# Patient Record
Sex: Female | Born: 1985 | Race: White | Hispanic: No | Marital: Single | Smoking: Former smoker
Health system: Southern US, Community
[De-identification: ages and names within clinical notes are randomized; demographics above are authoritative.]

## PROBLEM LIST (undated history)

## (undated) ENCOUNTER — Inpatient Hospital Stay (HOSPITAL_COMMUNITY): Payer: Self-pay

## (undated) DIAGNOSIS — O139 Gestational [pregnancy-induced] hypertension without significant proteinuria, unspecified trimester: Secondary | ICD-10-CM

## (undated) DIAGNOSIS — O24419 Gestational diabetes mellitus in pregnancy, unspecified control: Secondary | ICD-10-CM

## (undated) DIAGNOSIS — R112 Nausea with vomiting, unspecified: Secondary | ICD-10-CM

## (undated) DIAGNOSIS — Z9889 Other specified postprocedural states: Secondary | ICD-10-CM

---
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
## 2004-07-16 ENCOUNTER — Other Ambulatory Visit: Admission: RE | Admit: 2004-07-16 | Discharge: 2004-07-16 | Payer: Self-pay | Admitting: Gynecology

## 2006-12-19 ENCOUNTER — Emergency Department (HOSPITAL_COMMUNITY): Admission: EM | Admit: 2006-12-19 | Discharge: 2006-12-19 | Payer: Self-pay | Admitting: Family Medicine

## 2007-01-23 ENCOUNTER — Emergency Department (HOSPITAL_COMMUNITY): Admission: EM | Admit: 2007-01-23 | Discharge: 2007-01-24 | Payer: Self-pay | Admitting: Emergency Medicine

## 2009-11-28 DIAGNOSIS — IMO0002 Reserved for concepts with insufficient information to code with codable children: Secondary | ICD-10-CM

## 2009-11-28 DIAGNOSIS — O139 Gestational [pregnancy-induced] hypertension without significant proteinuria, unspecified trimester: Secondary | ICD-10-CM

## 2009-11-28 HISTORY — DX: Gestational (pregnancy-induced) hypertension without significant proteinuria, unspecified trimester: O13.9

## 2010-01-20 ENCOUNTER — Emergency Department (HOSPITAL_COMMUNITY): Admission: EM | Admit: 2010-01-20 | Discharge: 2010-01-20 | Payer: Self-pay | Admitting: Family Medicine

## 2010-04-14 ENCOUNTER — Ambulatory Visit (HOSPITAL_COMMUNITY): Admission: RE | Admit: 2010-04-14 | Discharge: 2010-04-14 | Payer: Self-pay | Admitting: Obstetrics and Gynecology

## 2010-05-03 ENCOUNTER — Emergency Department (HOSPITAL_COMMUNITY): Admission: EM | Admit: 2010-05-03 | Discharge: 2010-05-03 | Payer: Self-pay | Admitting: Emergency Medicine

## 2010-06-11 ENCOUNTER — Inpatient Hospital Stay (HOSPITAL_COMMUNITY): Admission: AD | Admit: 2010-06-11 | Discharge: 2010-06-11 | Payer: Self-pay | Admitting: Obstetrics and Gynecology

## 2010-06-11 ENCOUNTER — Ambulatory Visit (HOSPITAL_COMMUNITY): Admission: RE | Admit: 2010-06-11 | Discharge: 2010-06-11 | Payer: Self-pay | Admitting: Obstetrics and Gynecology

## 2010-06-11 ENCOUNTER — Ambulatory Visit: Payer: Self-pay | Admitting: Gynecology

## 2010-06-29 ENCOUNTER — Encounter: Admission: RE | Admit: 2010-06-29 | Discharge: 2010-07-28 | Payer: Self-pay | Admitting: Obstetrics and Gynecology

## 2010-07-08 ENCOUNTER — Ambulatory Visit (HOSPITAL_COMMUNITY): Admission: RE | Admit: 2010-07-08 | Discharge: 2010-07-08 | Payer: Self-pay | Admitting: Obstetrics and Gynecology

## 2010-07-28 ENCOUNTER — Encounter: Admission: RE | Admit: 2010-07-28 | Discharge: 2010-08-26 | Payer: Self-pay | Admitting: Obstetrics and Gynecology

## 2010-08-19 ENCOUNTER — Inpatient Hospital Stay (HOSPITAL_COMMUNITY): Admission: RE | Admit: 2010-08-19 | Discharge: 2010-08-22 | Payer: Self-pay | Admitting: Obstetrics and Gynecology

## 2010-12-16 ENCOUNTER — Emergency Department (HOSPITAL_COMMUNITY)
Admission: EM | Admit: 2010-12-16 | Discharge: 2010-12-17 | Payer: Self-pay | Source: Home / Self Care | Admitting: Emergency Medicine

## 2010-12-20 LAB — COMPREHENSIVE METABOLIC PANEL
ALT: 35 U/L (ref 0–35)
Albumin: 4.5 g/dL (ref 3.5–5.2)
Alkaline Phosphatase: 75 U/L (ref 39–117)
CO2: 24 mEq/L (ref 19–32)
Calcium: 9.8 mg/dL (ref 8.4–10.5)
Creatinine, Ser: 0.65 mg/dL (ref 0.4–1.2)
Potassium: 4 mEq/L (ref 3.5–5.1)
Total Bilirubin: 0.5 mg/dL (ref 0.3–1.2)
Total Protein: 7.3 g/dL (ref 6.0–8.3)

## 2010-12-20 LAB — URINALYSIS, ROUTINE W REFLEX MICROSCOPIC
Nitrite: NEGATIVE
Protein, ur: NEGATIVE mg/dL
Specific Gravity, Urine: 1.021 (ref 1.005–1.030)
Urine Glucose, Fasting: NEGATIVE mg/dL
Urobilinogen, UA: 0.2 mg/dL (ref 0.0–1.0)

## 2010-12-20 LAB — CBC
Hemoglobin: 13.8 g/dL (ref 12.0–15.0)
MCH: 27.8 pg (ref 26.0–34.0)
RBC: 4.97 MIL/uL (ref 3.87–5.11)
WBC: 7.8 10*3/uL (ref 4.0–10.5)

## 2010-12-20 LAB — DIFFERENTIAL
Basophils Absolute: 0 10*3/uL (ref 0.0–0.1)
Basophils Relative: 0 % (ref 0–1)
Eosinophils Absolute: 0.1 10*3/uL (ref 0.0–0.7)
Eosinophils Relative: 2 % (ref 0–5)
Lymphocytes Relative: 32 % (ref 12–46)
Neutrophils Relative %: 59 % (ref 43–77)

## 2010-12-20 LAB — POCT PREGNANCY, URINE: Preg Test, Ur: NEGATIVE

## 2011-02-10 LAB — GLUCOSE, CAPILLARY
Glucose-Capillary: 80 mg/dL (ref 70–99)
Glucose-Capillary: 90 mg/dL (ref 70–99)
Glucose-Capillary: 90 mg/dL (ref 70–99)
Glucose-Capillary: 92 mg/dL (ref 70–99)
Glucose-Capillary: 98 mg/dL (ref 70–99)

## 2011-02-10 LAB — CBC
HCT: 37.6 % (ref 36.0–46.0)
MCH: 29.6 pg (ref 26.0–34.0)
MCHC: 34.3 g/dL (ref 30.0–36.0)
MCHC: 35.3 g/dL (ref 30.0–36.0)
MCV: 86.8 fL (ref 78.0–100.0)
Platelets: 208 10*3/uL (ref 150–400)
Platelets: 233 10*3/uL (ref 150–400)
WBC: 16.7 10*3/uL — ABNORMAL HIGH (ref 4.0–10.5)

## 2011-02-10 LAB — RPR: RPR Ser Ql: NONREACTIVE

## 2011-02-12 LAB — ALT: ALT: 82 U/L — ABNORMAL HIGH (ref 0–35)

## 2011-02-12 LAB — URINE MICROSCOPIC-ADD ON

## 2011-02-12 LAB — DIFFERENTIAL
Basophils Relative: 0 % (ref 0–1)
Eosinophils Absolute: 0.1 10*3/uL (ref 0.0–0.7)
Eosinophils Relative: 1 % (ref 0–5)
Lymphs Abs: 1.9 10*3/uL (ref 0.7–4.0)
Monocytes Relative: 6 % (ref 3–12)

## 2011-02-12 LAB — CBC
Hemoglobin: 12.8 g/dL (ref 12.0–15.0)
MCH: 30.6 pg (ref 26.0–34.0)
MCHC: 35.1 g/dL (ref 30.0–36.0)
MCV: 87.1 fL (ref 78.0–100.0)
RBC: 4.19 MIL/uL (ref 3.87–5.11)

## 2011-02-12 LAB — URINALYSIS, ROUTINE W REFLEX MICROSCOPIC
Bilirubin Urine: NEGATIVE
Glucose, UA: NEGATIVE mg/dL
Hgb urine dipstick: NEGATIVE
Ketones, ur: NEGATIVE mg/dL
Specific Gravity, Urine: 1.02 (ref 1.005–1.030)
pH: 7 (ref 5.0–8.0)

## 2011-02-12 LAB — URINE CULTURE

## 2011-02-14 LAB — URINE MICROSCOPIC-ADD ON

## 2011-02-14 LAB — URINALYSIS, ROUTINE W REFLEX MICROSCOPIC
Glucose, UA: NEGATIVE mg/dL
Protein, ur: 30 mg/dL — AB
Specific Gravity, Urine: 1.013 (ref 1.005–1.030)
pH: 7 (ref 5.0–8.0)

## 2011-02-18 LAB — POCT URINALYSIS DIP (DEVICE)
Hgb urine dipstick: NEGATIVE
Protein, ur: 30 mg/dL — AB
Specific Gravity, Urine: 1.02 (ref 1.005–1.030)
Urobilinogen, UA: 2 mg/dL — ABNORMAL HIGH (ref 0.0–1.0)

## 2011-02-18 LAB — POCT PREGNANCY, URINE: Preg Test, Ur: POSITIVE

## 2011-08-11 ENCOUNTER — Emergency Department (HOSPITAL_COMMUNITY)
Admission: EM | Admit: 2011-08-11 | Discharge: 2011-08-11 | Disposition: A | Discharge status: Home / Self Care | Payer: Medicaid Other | Attending: Emergency Medicine | Admitting: Emergency Medicine

## 2011-08-11 DIAGNOSIS — R300 Dysuria: Secondary | ICD-10-CM | POA: Insufficient documentation

## 2011-08-11 DIAGNOSIS — A64 Unspecified sexually transmitted disease: Secondary | ICD-10-CM | POA: Insufficient documentation

## 2011-08-11 LAB — URINALYSIS, ROUTINE W REFLEX MICROSCOPIC
Bilirubin Urine: NEGATIVE
Ketones, ur: NEGATIVE mg/dL
Nitrite: NEGATIVE
Protein, ur: NEGATIVE mg/dL
Specific Gravity, Urine: 1.02 (ref 1.005–1.030)
Urobilinogen, UA: 0.2 mg/dL (ref 0.0–1.0)
pH: 7 (ref 5.0–8.0)

## 2011-08-11 LAB — URINE MICROSCOPIC-ADD ON

## 2011-08-11 LAB — WET PREP, GENITAL

## 2011-08-12 LAB — GC/CHLAMYDIA PROBE AMP, GENITAL
Chlamydia, DNA Probe: NEGATIVE
GC Probe Amp, Genital: NEGATIVE

## 2011-08-13 LAB — URINE CULTURE: Colony Count: >=100000

## 2012-12-21 ENCOUNTER — Encounter (HOSPITAL_BASED_OUTPATIENT_CLINIC_OR_DEPARTMENT_OTHER): Payer: Self-pay | Admitting: *Deleted

## 2012-12-21 ENCOUNTER — Emergency Department (HOSPITAL_BASED_OUTPATIENT_CLINIC_OR_DEPARTMENT_OTHER)
Admission: EM | Admit: 2012-12-21 | Discharge: 2012-12-21 | Disposition: A | Discharge status: Home / Self Care | Payer: Self-pay | Attending: Emergency Medicine | Admitting: Emergency Medicine

## 2012-12-21 DIAGNOSIS — J069 Acute upper respiratory infection, unspecified: Secondary | ICD-10-CM | POA: Insufficient documentation

## 2012-12-21 MED ORDER — HYDROCOD POLST-CHLORPHEN POLST 10-8 MG/5ML PO LQCR: 5.0000 mL | Freq: Two times a day (BID) | ORAL | Status: DC | PRN

## 2012-12-21 NOTE — ED Provider Notes (Signed)
Medical screening examination/treatment/procedure(s) were performed by non-physician practitioner and as supervising physician I was immediately available for consultation/collaboration.   Carleene Cooper III, MD 12/21/12 2011

## 2012-12-21 NOTE — ED Notes (Signed)
Pt c/o URI symptoms x 3 days 

## 2012-12-21 NOTE — ED Notes (Signed)
Pt reports generalized body aches, cough and diarrhea x 2 days.

## 2012-12-21 NOTE — ED Provider Notes (Signed)
History     CSN: [id number]  Arrival date & time 12/21/12  1732   First MD Initiated Contact with Patient 12/21/12 1740      Chief Complaint  Patient presents with  . URI    (Consider location/radiation/quality/duration/timing/severity/associated sxs/prior treatment) Patient is a 27 y.o. female presenting with URI. The history is provided by the patient. No language interpreter was used.  URI The primary symptoms include cough. Primary symptoms do not include fever, sore throat, nausea or vomiting. The current episode started 3 to 5 days ago. This is a new problem. The problem has not changed since onset. Symptoms associated with the illness include congestion.    History reviewed. No pertinent past medical history.  History reviewed. No pertinent past surgical history.  History reviewed. No pertinent family history.  History  Substance Use Topics  . Smoking status: Never Smoker   . Smokeless tobacco: Not on file  . Alcohol Use: No    OB History    Grav Para Term Preterm Abortions TAB SAB Ect Mult Living                  Review of Systems  Constitutional: Negative for fever.  HENT: Positive for congestion. Negative for sore throat.   Respiratory: Positive for cough.   Gastrointestinal: Negative for nausea and vomiting.    Allergies  Penicillins  Home Medications   Current Outpatient Rx  Name  Route  Sig  Dispense  Refill  . HYDROCOD POLST-CPM POLST ER 10-8 MG/5ML PO LQCR   Oral   Take 5 mLs by mouth every 12 (twelve) hours as needed.   140 mL   0     BP 135/88  Pulse 100  Temp 99.3 F (37.4 C) (Oral)  Resp 16  Ht 5\' 1"  (1.549 m)  Wt 230 lb (104.327 kg)  BMI 43.46 kg/m2  SpO2 100%  LMP 12/20/2012  Physical Exam  Nursing note and vitals reviewed. Constitutional: She is oriented to person, place, and time. She appears well-developed and well-nourished.  HENT:  Head: Normocephalic and atraumatic.  Right Ear: External ear normal.  Left Ear:  External ear normal.  Nose: Rhinorrhea present.  Eyes: Conjunctivae normal and EOM are normal.  Neck: Neck supple.  Cardiovascular: Normal rate and regular rhythm.   Pulmonary/Chest: Effort normal and breath sounds normal.  Musculoskeletal: Normal range of motion.  Neurological: She is alert and oriented to person, place, and time.    ED Course  Procedures (including critical care time)  Labs Reviewed - No data to display No results found.   1. URI (upper respiratory infection)       MDM  Lungs clear:will treat symptomatically for cough        Teressa Lower, NP 12/21/12 1805

## 2013-04-29 ENCOUNTER — Emergency Department (HOSPITAL_BASED_OUTPATIENT_CLINIC_OR_DEPARTMENT_OTHER)
Admission: EM | Admit: 2013-04-29 | Discharge: 2013-04-29 | Disposition: A | Discharge status: Home / Self Care | Payer: Self-pay | Attending: Emergency Medicine | Admitting: Emergency Medicine

## 2013-04-29 ENCOUNTER — Encounter (HOSPITAL_BASED_OUTPATIENT_CLINIC_OR_DEPARTMENT_OTHER): Payer: Self-pay | Admitting: *Deleted

## 2013-04-29 DIAGNOSIS — R05 Cough: Secondary | ICD-10-CM | POA: Insufficient documentation

## 2013-04-29 DIAGNOSIS — N390 Urinary tract infection, site not specified: Secondary | ICD-10-CM | POA: Insufficient documentation

## 2013-04-29 DIAGNOSIS — J029 Acute pharyngitis, unspecified: Secondary | ICD-10-CM | POA: Insufficient documentation

## 2013-04-29 DIAGNOSIS — J069 Acute upper respiratory infection, unspecified: Secondary | ICD-10-CM | POA: Insufficient documentation

## 2013-04-29 DIAGNOSIS — R059 Cough, unspecified: Secondary | ICD-10-CM | POA: Insufficient documentation

## 2013-04-29 DIAGNOSIS — J3489 Other specified disorders of nose and nasal sinuses: Secondary | ICD-10-CM | POA: Insufficient documentation

## 2013-04-29 DIAGNOSIS — Z3202 Encounter for pregnancy test, result negative: Secondary | ICD-10-CM | POA: Insufficient documentation

## 2013-04-29 DIAGNOSIS — R1310 Dysphagia, unspecified: Secondary | ICD-10-CM | POA: Insufficient documentation

## 2013-04-29 DIAGNOSIS — R300 Dysuria: Secondary | ICD-10-CM | POA: Insufficient documentation

## 2013-04-29 DIAGNOSIS — R070 Pain in throat: Secondary | ICD-10-CM | POA: Insufficient documentation

## 2013-04-29 DIAGNOSIS — Z87891 Personal history of nicotine dependence: Secondary | ICD-10-CM | POA: Insufficient documentation

## 2013-04-29 LAB — URINALYSIS, ROUTINE W REFLEX MICROSCOPIC
Ketones, ur: NEGATIVE mg/dL
Nitrite: NEGATIVE
Specific Gravity, Urine: 1.027 (ref 1.005–1.030)
pH: 6.5 (ref 5.0–8.0)

## 2013-04-29 LAB — RAPID STREP SCREEN (MED CTR MEBANE ONLY): Streptococcus, Group A Screen (Direct): NEGATIVE

## 2013-04-29 LAB — URINE MICROSCOPIC-ADD ON

## 2013-04-29 MED ORDER — SULFAMETHOXAZOLE-TRIMETHOPRIM 800-160 MG PO TABS: 1.0000 | ORAL_TABLET | Freq: Two times a day (BID) | ORAL | Status: AC

## 2013-04-29 NOTE — ED Notes (Signed)
Pt c/o URI symptoms x 1 week and UTI symtpoms x 1 day

## 2013-04-29 NOTE — ED Provider Notes (Signed)
History     CSN: [id number]  Arrival date & time 04/29/13  1322   First MD Initiated Contact with Patient 04/29/13 1331      Chief Complaint  Patient presents with  . URI  . Hematuria    (Consider location/radiation/quality/duration/timing/severity/associated sxs/prior treatment) Patient is a 27 y.o. female presenting with URI and hematuria.  URI Hematuria   Pt reports about a week of dry cough, progressed to nasal congestion, sore throat and productive cough about 3 days ago. She has moderate aching pain in throat, worse with coughing and swallowing. No fever. She works in Audiological scientist.   She also reports small amount of blood in urine and dysuria since yesterday evening. No flank pain or fever. She has had similar symptoms in the past with UTI.  History reviewed. No pertinent past medical history.  History reviewed. No pertinent past surgical history.  History reviewed. No pertinent family history.  History  Substance Use Topics  . Smoking status: Former Games developer  . Smokeless tobacco: Not on file  . Alcohol Use: No    OB History   Grav Para Term Preterm Abortions TAB SAB Ect Mult Living                  Review of Systems  Genitourinary: Positive for hematuria.   All other systems reviewed and are negative except as noted in HPI.    Allergies  Penicillins  Home Medications  No current outpatient prescriptions on file.  BP 148/92  Pulse 99  Temp(Src) 98.3 F (36.8 C) (Oral)  Resp 16  SpO2 100%  LMP 04/14/2013  Physical Exam  Nursing note and vitals reviewed. Constitutional: She is oriented to person, place, and time. She appears well-developed and well-nourished.  HENT:  Head: Normocephalic and atraumatic.  Tonsils 2+ but no exudate  Eyes: EOM are normal. Pupils are equal, round, and reactive to light.  Neck: Normal range of motion. Neck supple.  Cardiovascular: Normal rate, normal heart sounds and intact distal pulses.   Pulmonary/Chest: Effort normal  and breath sounds normal.  Abdominal: Bowel sounds are normal. She exhibits no distension. There is no tenderness.  Musculoskeletal: Normal range of motion. She exhibits no edema and no tenderness.  Lymphadenopathy:    She has no cervical adenopathy.  Neurological: She is alert and oriented to person, place, and time. She has normal strength. No cranial nerve deficit or sensory deficit.  Skin: Skin is warm and dry. No rash noted.  Psychiatric: She has a normal mood and affect.    ED Course  Procedures (including critical care time)  Labs Reviewed  URINALYSIS, ROUTINE W REFLEX MICROSCOPIC - Abnormal; Notable for the following:    APPearance CLOUDY (*)    Leukocytes, UA MODERATE (*)    All other components within normal limits  URINE MICROSCOPIC-ADD ON - Abnormal; Notable for the following:    Bacteria, UA MANY (*)    All other components within normal limits  RAPID STREP SCREEN  URINE CULTURE  CULTURE, GROUP A STREP  PREGNANCY, URINE   No results found.   1. UTI (urinary tract infection)   2. URI (upper respiratory infection)       MDM  UA suspicious for UTI given symptoms. Will check strep as well to coordinate Abx if necessary.   2:30 PM Strep neg, likely viral URI with early UTI. Bactrim and symptomatic OTC meds.       Erva Koke B. Bernette Mayers, MD 04/29/13 1430

## 2013-05-01 LAB — URINE CULTURE: Colony Count: >=100000

## 2013-05-01 LAB — CULTURE, GROUP A STREP

## 2013-05-02 ENCOUNTER — Telehealth (HOSPITAL_COMMUNITY): Payer: Self-pay | Admitting: Emergency Medicine

## 2013-05-02 NOTE — ED Notes (Signed)
Post ED Visit - Positive Culture Follow-up  Culture report reviewed by antimicrobial stewardship pharmacist: []  Wes Dulaney, Pharm.D., BCPS []  Celedonio Miyamoto, Pharm.D., BCPS [x]  Georgina Pillion, [address].D., BCPS []  [place], [address].D., BCPS, AAHIVP []  Estella Husk, Pharm.D., BCPS, AAHIVP  Positive urine culture Treated with Bactrim, organism sensitive to the same and no further patient follow-up is required at this time.  Kylie A Holland 05/02/2013, 12:37 PM

## 2014-06-19 ENCOUNTER — Encounter (HOSPITAL_BASED_OUTPATIENT_CLINIC_OR_DEPARTMENT_OTHER): Payer: Self-pay | Admitting: Emergency Medicine

## 2014-06-19 ENCOUNTER — Emergency Department (HOSPITAL_BASED_OUTPATIENT_CLINIC_OR_DEPARTMENT_OTHER)
Admission: EM | Admit: 2014-06-19 | Discharge: 2014-06-19 | Disposition: A | Discharge status: Home / Self Care | Payer: BC Managed Care – PPO | Attending: Emergency Medicine | Admitting: Emergency Medicine

## 2014-06-19 DIAGNOSIS — R51 Headache: Secondary | ICD-10-CM | POA: Insufficient documentation

## 2014-06-19 DIAGNOSIS — O2390 Unspecified genitourinary tract infection in pregnancy, unspecified trimester: Secondary | ICD-10-CM | POA: Insufficient documentation

## 2014-06-19 DIAGNOSIS — Z3490 Encounter for supervision of normal pregnancy, unspecified, unspecified trimester: Secondary | ICD-10-CM

## 2014-06-19 DIAGNOSIS — F17200 Nicotine dependence, unspecified, uncomplicated: Secondary | ICD-10-CM | POA: Insufficient documentation

## 2014-06-19 DIAGNOSIS — N12 Tubulo-interstitial nephritis, not specified as acute or chronic: Secondary | ICD-10-CM | POA: Insufficient documentation

## 2014-06-19 DIAGNOSIS — Z8632 Personal history of gestational diabetes: Secondary | ICD-10-CM | POA: Insufficient documentation

## 2014-06-19 DIAGNOSIS — O9989 Other specified diseases and conditions complicating pregnancy, childbirth and the puerperium: Secondary | ICD-10-CM | POA: Insufficient documentation

## 2014-06-19 DIAGNOSIS — R197 Diarrhea, unspecified: Secondary | ICD-10-CM | POA: Insufficient documentation

## 2014-06-19 DIAGNOSIS — Z880 Allergy status to penicillin: Secondary | ICD-10-CM | POA: Insufficient documentation

## 2014-06-19 HISTORY — DX: Gestational diabetes mellitus in pregnancy, unspecified control: O24.419

## 2014-06-19 LAB — URINALYSIS, ROUTINE W REFLEX MICROSCOPIC
BILIRUBIN URINE: NEGATIVE
GLUCOSE, UA: NEGATIVE mg/dL
Hgb urine dipstick: NEGATIVE
KETONES UR: NEGATIVE mg/dL
Nitrite: POSITIVE — AB
PH: 6 (ref 5.0–8.0)
Protein, ur: NEGATIVE mg/dL
Specific Gravity, Urine: 1.024 (ref 1.005–1.030)
Urobilinogen, UA: 0.2 mg/dL (ref 0.0–1.0)

## 2014-06-19 LAB — COMPREHENSIVE METABOLIC PANEL
ALBUMIN: 4.2 g/dL (ref 3.5–5.2)
ALT: 21 U/L (ref 0–35)
AST: 21 U/L (ref 0–37)
Alkaline Phosphatase: 78 U/L (ref 39–117)
Anion gap: 15 (ref 5–15)
BUN: 9 mg/dL (ref 6–23)
CALCIUM: 10.2 mg/dL (ref 8.4–10.5)
CO2: 24 mEq/L (ref 19–32)
CREATININE: 0.6 mg/dL (ref 0.50–1.10)
Chloride: 102 mEq/L (ref 96–112)
GFR calc Af Amer: >90 mL/min (ref >90)
GFR calc non Af Amer: >90 mL/min (ref >90)
Glucose, Bld: 91 mg/dL (ref 70–99)
Potassium: 4.5 mEq/L (ref 3.7–5.3)
SODIUM: 141 meq/L (ref 137–147)
TOTAL PROTEIN: 7.7 g/dL (ref 6.0–8.3)
Total Bilirubin: <0.2 mg/dL — ABNORMAL LOW (ref 0.3–1.2)

## 2014-06-19 LAB — CBC WITH DIFFERENTIAL/PLATELET
Basophils Absolute: 0 10*3/uL (ref 0.0–0.1)
Basophils Relative: 0 % (ref 0–1)
EOS PCT: 1 % (ref 0–5)
Eosinophils Absolute: 0.1 10*3/uL (ref 0.0–0.7)
HEMATOCRIT: 42.1 % (ref 36.0–46.0)
HEMOGLOBIN: 14.7 g/dL (ref 12.0–15.0)
LYMPHS PCT: 22 % (ref 12–46)
Lymphs Abs: 2.1 10*3/uL (ref 0.7–4.0)
MCH: 30.1 pg (ref 26.0–34.0)
MCHC: 34.9 g/dL (ref 30.0–36.0)
MCV: 86.1 fL (ref 78.0–100.0)
MONO ABS: 0.7 10*3/uL (ref 0.1–1.0)
MONOS PCT: 7 % (ref 3–12)
NEUTROS ABS: 6.6 10*3/uL (ref 1.7–7.7)
Neutrophils Relative %: 69 % (ref 43–77)
Platelets: 262 10*3/uL (ref 150–400)
RBC: 4.89 MIL/uL (ref 3.87–5.11)
RDW: 13.4 % (ref 11.5–15.5)
WBC: 9.5 10*3/uL (ref 4.0–10.5)

## 2014-06-19 LAB — URINE MICROSCOPIC-ADD ON

## 2014-06-19 LAB — PREGNANCY, URINE: Preg Test, Ur: POSITIVE — AB

## 2014-06-19 MED ORDER — COMPLETENATE 29-1 MG PO CHEW: 1.0000 | CHEWABLE_TABLET | Freq: Every day | ORAL | Status: DC

## 2014-06-19 MED ORDER — CEPHALEXIN 500 MG PO CAPS: 500.0000 mg | ORAL_CAPSULE | Freq: Two times a day (BID) | ORAL | Status: DC

## 2014-06-19 MED ORDER — OXYCODONE HCL 5 MG PO TABS
5.0000 mg | ORAL_TABLET | ORAL | Status: AC
  Administered 2014-06-19: 5 mg via ORAL
  Filled 2014-06-19: qty 1

## 2014-06-19 MED ORDER — DEXTROSE 5 % IV SOLN
1.0000 g | Freq: Once | INTRAVENOUS | Status: AC
  Administered 2014-06-19: 1 g via INTRAVENOUS

## 2014-06-19 MED ORDER — ACETAMINOPHEN 325 MG PO TABS
650.0000 mg | ORAL_TABLET | Freq: Once | ORAL | Status: AC
  Administered 2014-06-19: 650 mg via ORAL
  Filled 2014-06-19: qty 2

## 2014-06-19 MED ORDER — CEFTRIAXONE SODIUM 1 G IJ SOLR
INTRAMUSCULAR | Status: AC
  Filled 2014-06-19: qty 10

## 2014-06-19 MED ORDER — OXYCODONE-ACETAMINOPHEN 5-325 MG PO TABS: 1.0000 | ORAL_TABLET | Freq: Three times a day (TID) | ORAL | Status: DC | PRN

## 2014-06-19 MED ORDER — SODIUM CHLORIDE 0.9 % IV BOLUS (SEPSIS)
1000.0000 mL | INTRAVENOUS | Status: AC
  Administered 2014-06-19: 1000 mL via INTRAVENOUS

## 2014-06-19 NOTE — ED Notes (Signed)
Pt given a glass of water and was able to drink without emesis

## 2014-06-19 NOTE — ED Notes (Signed)
Headaches, nausea, lower back pain and cramping since Friday. Pt states possibility of pregnancy.

## 2014-06-19 NOTE — ED Provider Notes (Signed)
CSN: [id number]     Arrival date & time 06/19/14  1506 History   First MD Initiated Contact with Patient 06/19/14 1643     Chief Complaint  Patient presents with  . Back Pain     (Consider location/radiation/quality/duration/timing/severity/associated sxs/prior Treatment) Patient is a 28 y.o. female presenting with back pain. The history is provided by the patient.  Back Pain Location:  Lumbar spine Quality:  Aching Radiates to:  Does not radiate Pain severity:  Moderate Pain is:  Same all the time Onset quality:  Gradual Duration:  6 days Timing:  Constant Progression:  Unchanged Chronicity:  New Context comment:  At rest Relieved by:  Nothing Worsened by:  Nothing tried Ineffective treatments:  None tried Associated symptoms: abdominal pain and headaches   Associated symptoms: no chest pain, no dysuria and no fever     Past Medical History  Diagnosis Date  . Gestational diabetes    Past Surgical History  Procedure Laterality Date  . Cesarean section     History reviewed. No pertinent family history. History  Substance Use Topics  . Smoking status: Current Some Day Smoker  . Smokeless tobacco: Not on file  . Alcohol Use: No   OB History   Grav Para Term Preterm Abortions TAB SAB Ect Mult Living                 Review of Systems  Constitutional: Negative for fever and fatigue.  HENT: Negative for congestion and drooling.   Eyes: Negative for pain.  Respiratory: Negative for cough and shortness of breath.   Cardiovascular: Negative for chest pain.  Gastrointestinal: Positive for nausea, vomiting, abdominal pain and diarrhea.  Genitourinary: Negative for dysuria and hematuria.  Musculoskeletal: Negative for back pain, gait problem and neck pain.  Skin: Negative for color change.  Neurological: Positive for headaches. Negative for dizziness.  Hematological: Negative for adenopathy.  Psychiatric/Behavioral: Negative for behavioral problems.  All other  systems reviewed and are negative.     Allergies  Penicillins  Home Medications   Prior to Admission medications   Not on File   BP 153/91  Pulse 66  Temp(Src) 97.5 F (36.4 C) (Oral)  Resp 18  Wt 275 lb 3 oz (124.824 kg)  SpO2 100%  LMP 05/17/2014 Physical Exam  Nursing note and vitals reviewed. Constitutional: She is oriented to person, place, and time. She appears well-developed and well-nourished.  HENT:  Head: Normocephalic and atraumatic.  Mouth/Throat: Oropharynx is clear and moist. No oropharyngeal exudate.  Eyes: Conjunctivae and EOM are normal. Pupils are equal, round, and reactive to light.  Neck: Normal range of motion. Neck supple.  Cardiovascular: Normal rate, regular rhythm, normal heart sounds and intact distal pulses.  Exam reveals no gallop and no friction rub.   No murmur heard. Pulmonary/Chest: Effort normal and breath sounds normal. No respiratory distress. She has no wheezes.  Abdominal: Soft. Bowel sounds are normal. There is no tenderness. There is no rebound and no guarding.  Musculoskeletal: Normal range of motion. She exhibits no edema and no tenderness.  Mild CVA tenderness to palpation bilaterally.  Neurological: She is alert and oriented to person, place, and time.  Skin: Skin is warm and dry.  Psychiatric: She has a normal mood and affect. Her behavior is normal.    ED Course  Procedures (including critical care time) Labs Review Labs Reviewed  PREGNANCY, URINE - Abnormal; Notable for the following:    Preg Test, Ur POSITIVE (*)  All other components within normal limits  URINALYSIS, ROUTINE W REFLEX MICROSCOPIC - Abnormal; Notable for the following:    APPearance CLOUDY (*)    Nitrite POSITIVE (*)    Leukocytes, UA MODERATE (*)    All other components within normal limits  URINE MICROSCOPIC-ADD ON - Abnormal; Notable for the following:    Bacteria, UA MANY (*)    All other components within normal limits  URINE CULTURE  CBC  WITH DIFFERENTIAL  COMPREHENSIVE METABOLIC PANEL    Imaging Review No results found.   EKG Interpretation None      MDM   Final diagnoses:  Pyelonephritis  Pregnant    4:57 PM 28 y.o. female who pw intermittent HA's, frequency, low back pain, lower abd cramping, nausea, vomiting, diarrhea for 6-7 days. She denies any fevers. She currently does not have a headache. She denies any vision changes. She is found to be pregnant today. She was suspicious for this. Her LMP was June 20. She has not had any vaginal bleeding, vaginal discharge. She denies any vision changes. She notes that she had a C-section with her first pregnancy d/t a nuchal chord. She also had gestational diabetes and preeclampsia with issues with her blood pressure during the pregnancy. She is afebrile here and mildly hypertensive on initial triage vital signs but also having low back pain. Will treat symptomatically with IV fluids, Tylenol, and a dose of Rocephin as it looks like she also has a UTI. While she has some mild nonspecific pelvic cramping she has no focal abdominal tenderness or adnexal tenderness. Do not suspect ectopic preg.   7:46 PM: Pain controlled. Pt feeling better, tolerating po. Got IV rocephin here.  I have discussed the diagnosis/risks/treatment options with the patient and believe the pt to be eligible for discharge home to follow-up with her obgyn. We also discussed returning to the ED immediately if new or worsening sx occur. We discussed the sx which are most concerning (e.g., inability to tolerate abx, inc vomiting, inc pain, vag bleeding) that necessitate immediate return. Medications administered to the patient during their visit and any new prescriptions provided to the patient are listed below.  Medications given during this visit Medications  sodium chloride 0.9 % bolus 1,000 mL (1,000 mLs Intravenous New Bag/Given 06/19/14 1833)  cefTRIAXone (ROCEPHIN) 1 g in dextrose 5 % 50 mL IVPB (1 g  Intravenous New Bag/Given 06/19/14 1835)  acetaminophen (TYLENOL) tablet 650 mg (650 mg Oral Given 06/19/14 1836)  oxyCODONE (Oxy IR/ROXICODONE) immediate release tablet 5 mg (5 mg Oral Given 06/19/14 1844)    New Prescriptions   CEPHALEXIN (KEFLEX) 500 MG CAPSULE    Take 1 capsule (500 mg total) by mouth 2 (two) times daily.   OXYCODONE-ACETAMINOPHEN (PERCOCET) 5-325 MG PER TABLET    Take 1 tablet by mouth every 8 (eight) hours as needed for moderate pain or severe pain.   PRENATAL VITAMIN W/FE, FA (NATACHEW) 29-1 MG CHEW CHEWABLE TABLET    Chew 1 tablet by mouth daily at 12 noon.      Junius ArgyleForrest S Ahmad Vanwey, MD 06/19/14 1946

## 2014-06-22 LAB — URINE CULTURE: Colony Count: >=100000

## 2014-06-23 ENCOUNTER — Telehealth (HOSPITAL_BASED_OUTPATIENT_CLINIC_OR_DEPARTMENT_OTHER): Payer: Self-pay

## 2014-06-23 NOTE — Telephone Encounter (Signed)
Post ED Visit - Positive Culture Follow-up  Culture report reviewed by antimicrobial stewardship pharmacist: []  Wes Dulaney, Pharm.D., BCPS [x]  Celedonio MiyamotoJeremy Frens, Pharm.D., BCPS []  Georgina PillionElizabeth Martin, [address]D., BCPS []  [place]h Pham, [address]D., BCPS, AAHIVP []  Estella HuskMichelle Turner, Pharm.D., BCPS, AAHIVP []    Positive Urine culture, >/= 100,000 colonies -> E Coli Treated with Keflex, organism sensitive to the same and no further patient follow-up is required at this time.  Arvid RightClark, Lindalee Huizinga Dorn 06/23/2014, 9:20 PM

## 2014-08-06 LAB — OB RESULTS CONSOLE HEPATITIS B SURFACE ANTIGEN: Hepatitis B Surface Ag: NEGATIVE

## 2014-08-06 LAB — OB RESULTS CONSOLE RUBELLA ANTIBODY, IGM: RUBELLA: IMMUNE

## 2014-08-06 LAB — OB RESULTS CONSOLE ABO/RH: RH Type: NEGATIVE

## 2014-08-06 LAB — OB RESULTS CONSOLE RPR: RPR: NONREACTIVE

## 2014-08-06 LAB — OB RESULTS CONSOLE ANTIBODY SCREEN: Antibody Screen: NEGATIVE

## 2014-08-06 LAB — OB RESULTS CONSOLE HIV ANTIBODY (ROUTINE TESTING): HIV: NONREACTIVE

## 2014-08-08 ENCOUNTER — Other Ambulatory Visit: Payer: Self-pay

## 2014-08-22 ENCOUNTER — Ambulatory Visit (HOSPITAL_COMMUNITY): Payer: BC Managed Care – PPO

## 2014-08-29 ENCOUNTER — Encounter (HOSPITAL_COMMUNITY): Payer: Self-pay

## 2014-08-29 ENCOUNTER — Ambulatory Visit (HOSPITAL_COMMUNITY)
Admission: RE | Admit: 2014-08-29 | Discharge: 2014-08-29 | Disposition: A | Discharge status: Home / Self Care | Payer: BC Managed Care – PPO | Source: Ambulatory Visit | Attending: Obstetrics and Gynecology | Admitting: Obstetrics and Gynecology

## 2014-08-29 VITALS — BP 128/59 | HR 86 | Wt 280.0 lb

## 2014-08-29 DIAGNOSIS — O99332 Smoking (tobacco) complicating pregnancy, second trimester: Secondary | ICD-10-CM | POA: Insufficient documentation

## 2014-08-29 DIAGNOSIS — O10012 Pre-existing essential hypertension complicating pregnancy, second trimester: Secondary | ICD-10-CM | POA: Diagnosis present

## 2014-08-29 DIAGNOSIS — Z3A16 16 weeks gestation of pregnancy: Secondary | ICD-10-CM | POA: Diagnosis not present

## 2014-08-29 DIAGNOSIS — O10912 Unspecified pre-existing hypertension complicating pregnancy, second trimester: Secondary | ICD-10-CM

## 2014-08-29 NOTE — Progress Notes (Signed)
MATERNAL FETAL MEDICINE CONSULT  Patient Name: Dominique Anderson Medical Record Number:  [id number] Date of Birth: Dec 27, 1985 Requesting Physician Name:  Philip Aspen, DO Date of Service: 08/29/2014  Chief Complaint Chronic hypertension  History of Present Illness Dominique Anderson was seen today secondary to chronic hypertension at the request of Philip Aspen, DO.  The patient is a 28 y.o. G2P1001,at [redacted]w[redacted]d with an EDD of 02/12/2015, by Ultrasound dating method.  Dominique Anderson blood pressure at her initial prenatal visit at 10 weeks of pregnancy was 142/92.  She has a history of preeclampsia in her last pregnancy, but has not had any BP elevations since then.  She is currently not taking any anti-hypertensive medications.  She denies headache, visual changes, RUQ pain, or swelling.  She also has a history of gestational diabetes in her last pregnancy.  She received an early one hour glucose tolerance test in this pregnancy which was normal.  Review of Systems Pertinent items are noted in HPI.  Patient History OB History  Gravida Para Term Preterm AB SAB TAB Ectopic Multiple Living  2 1 1       1     # Outcome Date GA Lbr Len/2nd Weight Sex Delivery Anes PTL Lv  2 CUR           1 TRM               Past Medical History  Diagnosis Date  . Gestational diabetes   . Hypertension     Past Surgical History  Procedure Laterality Date  . Cesarean section      History   Social History  . Marital Status: Single    Spouse Name: N/A    Number of Children: N/A  . Years of Education: N/A   Social History Main Topics  . Smoking status: Current Some Day Smoker  . Smokeless tobacco: None  . Alcohol Use: No  . Drug Use: No  . Sexual Activity: Yes    Birth Control/ Protection: None   Other Topics Concern  . None   Social History Narrative  . None    Physical Examination Filed Vitals:   08/29/14 1622  BP: 128/59  Pulse: 86   General appearance - alert, well appearing, and in no  distress  Assessment and Recommendations 1.  Chronic hypertension.  As Dominique Anderson BP on her initial prenatal visit at 10 weeks was 142/92 she meets criteria for chronic hypertension even though she did not have any elevated BP's prior to pregnancy  I discussed the range of complications that are associated with chronic hypertension including, but not limited to, fetal growth disturbance, preterm delivery, preeclampsia, and IUFD with Dominique Anderson.  Due to the increased risk of preeclampsia a CBC, AST, ALT, 24 hour urine collection, and serum creatinine has been performed to determine her baseline labs and proteinuria.  This should be repeated as clinically indicated based on disease symptoms or the appearance of worsening hypertension.  As chronic hypertension is associated with fetal growth restriction Dominique Anderson should have serial growth scans every 4-6 weeks after her anatomic survey at approximately 18 weeks.  Once or twice weekly fetal surveillance should be started at 32 weeks of gestation.  As treatment of women with mild chronic hypertension using oral anti-hypertensive agents has not been shown to improve maternal or neonatal outcomes, no medical treatment is needed at this time.  If Dominique Anderson develops blood pressures consistently above 150/100 an oral anti-hypertensive should be started.  Labetalol, nifedipine,  and methyldopa are all known to be safe in pregnancy.  Finally, due to her history of prior preeclampsia Dominique Anderson should take a daily baby aspirin as this has been shown to decrease the risk of recurrence.   I spent 15 minutes with Dominique Anderson today of which 50% was face-to-face counseling.  [facility].  Please do not hesitate to contact us with questions.   Rema FendtNITSCHE,Tera Pellicane, MD

## 2014-09-29 ENCOUNTER — Encounter (HOSPITAL_COMMUNITY): Payer: Self-pay

## 2014-10-09 ENCOUNTER — Encounter (HOSPITAL_COMMUNITY): Payer: Self-pay | Admitting: *Deleted

## 2014-10-09 ENCOUNTER — Inpatient Hospital Stay (HOSPITAL_COMMUNITY)
Admission: AD | Admit: 2014-10-09 | Discharge: 2014-10-09 | Disposition: A | Discharge status: Home / Self Care | Payer: BC Managed Care – PPO | Source: Ambulatory Visit | Attending: Obstetrics and Gynecology | Admitting: Obstetrics and Gynecology

## 2014-10-09 DIAGNOSIS — R030 Elevated blood-pressure reading, without diagnosis of hypertension: Secondary | ICD-10-CM | POA: Insufficient documentation

## 2014-10-09 DIAGNOSIS — O162 Unspecified maternal hypertension, second trimester: Secondary | ICD-10-CM

## 2014-10-09 DIAGNOSIS — O26892 Other specified pregnancy related conditions, second trimester: Secondary | ICD-10-CM | POA: Insufficient documentation

## 2014-10-09 DIAGNOSIS — O26899 Other specified pregnancy related conditions, unspecified trimester: Secondary | ICD-10-CM

## 2014-10-09 DIAGNOSIS — R109 Unspecified abdominal pain: Secondary | ICD-10-CM | POA: Insufficient documentation

## 2014-10-09 DIAGNOSIS — Z87891 Personal history of nicotine dependence: Secondary | ICD-10-CM | POA: Diagnosis not present

## 2014-10-09 DIAGNOSIS — O288 Other abnormal findings on antenatal screening of mother: Secondary | ICD-10-CM

## 2014-10-09 DIAGNOSIS — O9989 Other specified diseases and conditions complicating pregnancy, childbirth and the puerperium: Secondary | ICD-10-CM

## 2014-10-09 DIAGNOSIS — Z3A22 22 weeks gestation of pregnancy: Secondary | ICD-10-CM | POA: Diagnosis not present

## 2014-10-09 LAB — URINE MICROSCOPIC-ADD ON

## 2014-10-09 LAB — URINALYSIS, ROUTINE W REFLEX MICROSCOPIC
Bilirubin Urine: NEGATIVE
GLUCOSE, UA: NEGATIVE mg/dL
Hgb urine dipstick: NEGATIVE
Ketones, ur: NEGATIVE mg/dL
Nitrite: NEGATIVE
PH: 6.5 (ref 5.0–8.0)
PROTEIN: NEGATIVE mg/dL
SPECIFIC GRAVITY, URINE: 1.025 (ref 1.005–1.030)
Urobilinogen, UA: 0.2 mg/dL (ref 0.0–1.0)

## 2014-10-09 MED ORDER — LACTATED RINGERS IV BOLUS (SEPSIS): 1000.0000 mL | Freq: Once | INTRAVENOUS | Status: DC

## 2014-10-09 MED ORDER — ACETAMINOPHEN 325 MG PO TABS
650.0000 mg | ORAL_TABLET | ORAL | Status: AC
  Administered 2014-10-09: 650 mg via ORAL
  Filled 2014-10-09: qty 2

## 2014-10-09 NOTE — Discharge Instructions (Signed)

## 2014-10-09 NOTE — MAU Note (Signed)
Started yesterday, pain in upper abd- got real tight.  Then started in lower abd. ? Dominique PeltonBraxton Hicks, but has lasted so long.   Feels worse when she is laying down

## 2014-10-09 NOTE — MAU Provider Note (Signed)
History     CSN: [id number]  Arrival date and time: 10/09/14 1418   First Provider Initiated Contact with Patient 10/09/14 1646      Chief Complaint  Patient presents with  . Abdominal Pain   HPI Dominique ReedyAlecia A Anderson 28 y.o. G2P1001 @[redacted]w[redacted]d  presents complaining of abdominal pain that started yesterday afternoon.  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  She was active when this started but no more so than typical.  Pain is located across the middle of her stomach like a horizontal line along line of umbilicus.  Last night her pain was lower also but that is improved.  She has never before experienced this kind of pain.  It is 9/10 and sharp.  It is constant without any relief.  Tylenol this am was somewhat helpful.  She denies nausea, vomiting, vaginal discharge/bleeding/LOF, dysuria, fever.  She endorses good fetal movement.   OB History    Gravida Para Term Preterm AB TAB SAB Ectopic Multiple Living   2 1 1       1       Past Medical History  Diagnosis Date  . Gestational diabetes   . Hypertension     Past Surgical History  Procedure Laterality Date  . Cesarean section      History reviewed. No pertinent family history.  History  Substance Use Topics  . Smoking status: Former Smoker    Quit date: 02/06/2014  . Smokeless tobacco: Not on file  . Alcohol Use: No    Allergies:  Allergies  Allergen Reactions  . Penicillins Hives    Prescriptions prior to admission  Medication Sig Dispense Refill Last Dose  . acetaminophen (TYLENOL) 325 MG tablet Take 325 mg by mouth every 6 (six) hours as needed for mild pain.   10/09/2014 at Unknown time  . prenatal vitamin w/FE, FA (NATACHEW) 29-1 MG CHEW chewable tablet Chew 1 tablet by mouth daily at 12 noon. 30 tablet 0 10/09/2014 at Unknown time  . cephALEXin (KEFLEX) 500 MG capsule Take 1 capsule (500 mg total) by mouth 2 (two) times daily. (Patient not taking: Reported on 10/09/2014) 20 capsule 0   .  oxyCODONE-acetaminophen (PERCOCET) 5-325 MG per tablet Take 1 tablet by mouth every 8 (eight) hours as needed for moderate pain or severe pain. (Patient not taking: Reported on 10/09/2014) 5 tablet 0     ROS Pertinent ROS in HPI Physical Exam   Blood pressure 146/83, pulse 98, temperature 97.5 F (36.4 C), temperature source Oral, resp. rate 20, height 5' (1.524 m), weight 291 lb (131.997 kg), last menstrual period 05/17/2014.  Physical Exam  Constitutional: She is oriented to person, place, and time. She appears well-developed and well-nourished.  HENT:  Head: Normocephalic and atraumatic.  Eyes: EOM are normal.  Neck: Normal range of motion.  Cardiovascular: Normal rate and regular rhythm.   Respiratory: Effort normal and breath sounds normal. No respiratory distress.  GI: Soft. Bowel sounds are normal. She exhibits no distension. There is tenderness. There is no rebound and no guarding.  Tenderness diffusely throughout but increased like a horizontal band across umbilicus.  No erythema or warmth.  Musculoskeletal: Normal range of motion.  Neurological: She is alert and oriented to person, place, and time.  Skin: Skin is warm and dry.  Psychiatric: She has a normal mood and affect.   Results for orders placed or performed during the hospital encounter of 10/09/14 (from the past 24 hour(s))  Urinalysis, Routine w  reflex microscopic     Status: Abnormal   Collection Time: 10/09/14  2:50 PM  Result Value Ref Range   Color, Urine YELLOW YELLOW   APPearance CLEAR CLEAR   Specific Gravity, Urine 1.025 1.005 - 1.030   pH 6.5 5.0 - 8.0   Glucose, UA NEGATIVE NEGATIVE mg/dL   Hgb urine dipstick NEGATIVE NEGATIVE   Bilirubin Urine NEGATIVE NEGATIVE   Ketones, ur NEGATIVE NEGATIVE mg/dL   Protein, ur NEGATIVE NEGATIVE mg/dL   Urobilinogen, UA 0.2 0.0 - 1.0 mg/dL   Nitrite NEGATIVE NEGATIVE   Leukocytes, UA SMALL (A) NEGATIVE  Urine microscopic-add on     Status: Abnormal   Collection  Time: 10/09/14  2:50 PM  Result Value Ref Range   Squamous Epithelial / LPF FEW (A) RARE   WBC, UA 7-10 <3 WBC/hpf   RBC / HPF 0-2 <3 RBC/hpf   Bacteria, UA MANY (A) RARE   Urine-Other MUCOUS PRESENT      MAU Course  Procedures  MDM Blood pressures consistently 140s/80s.  No HA, no protein on urine.    Discussed pain and BP with Dr. Henderson CloudHorvath.  She is agreeable to discharge pt and have followup early next week  Assessment and Plan  A:  1. Abdominal pain affecting pregnancy   2. Elevated blood pressure affecting pregnancy in second trimester, antepartum    P: Discharge to home Return to MAU for worsening of symptoms/emergency Increase fluids Follow up as scheduled in office next Monday.    Bertram Denvereague Clark, Karen E 10/09/2014, 4:46 PM

## 2014-12-11 ENCOUNTER — Inpatient Hospital Stay (HOSPITAL_COMMUNITY)
Admission: AD | Admit: 2014-12-11 | Discharge: 2014-12-12 | Disposition: A | Discharge status: Home / Self Care | Payer: Medicaid Other | Source: Ambulatory Visit | Attending: Obstetrics and Gynecology | Admitting: Obstetrics and Gynecology

## 2014-12-11 ENCOUNTER — Encounter (HOSPITAL_COMMUNITY): Payer: Self-pay

## 2014-12-11 DIAGNOSIS — Z3A31 31 weeks gestation of pregnancy: Secondary | ICD-10-CM | POA: Diagnosis not present

## 2014-12-11 DIAGNOSIS — O368130 Decreased fetal movements, third trimester, not applicable or unspecified: Secondary | ICD-10-CM | POA: Diagnosis not present

## 2014-12-11 DIAGNOSIS — Z87891 Personal history of nicotine dependence: Secondary | ICD-10-CM | POA: Insufficient documentation

## 2014-12-11 DIAGNOSIS — A599 Trichomoniasis, unspecified: Secondary | ICD-10-CM | POA: Insufficient documentation

## 2014-12-11 DIAGNOSIS — O98313 Other infections with a predominantly sexual mode of transmission complicating pregnancy, third trimester: Secondary | ICD-10-CM | POA: Diagnosis not present

## 2014-12-11 DIAGNOSIS — O26893 Other specified pregnancy related conditions, third trimester: Secondary | ICD-10-CM | POA: Diagnosis not present

## 2014-12-11 DIAGNOSIS — O4693 Antepartum hemorrhage, unspecified, third trimester: Secondary | ICD-10-CM | POA: Insufficient documentation

## 2014-12-11 DIAGNOSIS — R109 Unspecified abdominal pain: Secondary | ICD-10-CM | POA: Insufficient documentation

## 2014-12-11 HISTORY — DX: Gestational (pregnancy-induced) hypertension without significant proteinuria, unspecified trimester: O13.9

## 2014-12-11 NOTE — MAU Note (Signed)
Abdominal cramping and pressure all week; states worse after working all day. Took shower tonight, when got out of shower had blood and mucous running down leg. No fetal movement since 4 pm. No bleeding since episode after coming out of the shower.

## 2014-12-12 DIAGNOSIS — A599 Trichomoniasis, unspecified: Secondary | ICD-10-CM

## 2014-12-12 DIAGNOSIS — O98313 Other infections with a predominantly sexual mode of transmission complicating pregnancy, third trimester: Secondary | ICD-10-CM

## 2014-12-12 DIAGNOSIS — Z3A31 31 weeks gestation of pregnancy: Secondary | ICD-10-CM

## 2014-12-12 LAB — URINALYSIS, ROUTINE W REFLEX MICROSCOPIC
Bilirubin Urine: NEGATIVE
GLUCOSE, UA: NEGATIVE mg/dL
KETONES UR: 15 mg/dL — AB
NITRITE: NEGATIVE
Protein, ur: NEGATIVE mg/dL
Specific Gravity, Urine: 1.025 (ref 1.005–1.030)
UROBILINOGEN UA: 0.2 mg/dL (ref 0.0–1.0)
pH: 6 (ref 5.0–8.0)

## 2014-12-12 LAB — WET PREP, GENITAL
CLUE CELLS WET PREP: NONE SEEN
Yeast Wet Prep HPF POC: NONE SEEN

## 2014-12-12 LAB — URINE MICROSCOPIC-ADD ON

## 2014-12-12 MED ORDER — METRONIDAZOLE 500 MG PO TABS
2000.0000 mg | ORAL_TABLET | Freq: Once | ORAL | Status: AC
  Administered 2014-12-12: 2000 mg via ORAL
  Filled 2014-12-12: qty 4

## 2014-12-12 NOTE — MAU Provider Note (Signed)
History     CSN: [id number]  Arrival date and time: 12/11/14 2322   None     Chief Complaint  Patient presents with  . Abdominal Cramping  . Vaginal Bleeding   HPI This is a 29 y.o. female at [redacted]w[redacted]d who presents with c/o one episode of bloody mucous running down her leg after getting out of the shower tonight. Has had cramping for a week. Decreased fetal movement since afternoon.   RN Note: Abdominal cramping and pressure all week; states worse after working all day. Took shower tonight, when got out of shower had blood and mucous running down leg. No fetal movement since 4 pm. No bleeding since episode after coming out of the shower.       OB History    Gravida Para Term Preterm AB TAB SAB Ectopic Multiple Living   Past Medical History  Diagnosis Date  . Gestational diabetes   . Hypertension   . Pregnancy induced hypertension     Past Surgical History  Procedure Laterality Date  . Cesarean section      History reviewed. No pertinent family history.  History  Substance Use Topics  . Smoking status: Former Smoker    Quit date: 02/06/2014  . Smokeless tobacco: Not on file  . Alcohol Use: No    Allergies:  Allergies  Allergen Reactions  . Penicillins Hives    Prescriptions prior to admission  Medication Sig Dispense Refill Last Dose  . acetaminophen (TYLENOL) 325 MG tablet Take 325 mg by mouth every 6 (six) hours as needed for mild pain.   12/10/2014 at Unknown time  . prenatal vitamin w/FE, FA (NATACHEW) 29-1 MG CHEW chewable tablet Chew 1 tablet by mouth daily at 12 noon. 30 tablet 0 12/10/2014 at Unknown time    Review of Systems  Constitutional: Negative for fever, chills and malaise/fatigue.  Gastrointestinal: Positive for abdominal pain (cramping). Negative for nausea, vomiting, diarrhea and constipation.  Genitourinary:       Vaginal bleeding x 1   Musculoskeletal: Positive for back pain (chronic).  Neurological: Negative for  dizziness.   Physical Exam   Blood pressure 137/79, pulse 124, temperature 98.3 F (36.8 C), temperature source Oral, resp. rate 20, last menstrual period 05/17/2014.  Physical Exam  Constitutional: She is oriented to person, place, and time. She appears well-developed and well-nourished. No distress.  HENT:  Head: Normocephalic.  Cardiovascular: Normal rate.   Respiratory: Effort normal.  GI: Soft. She exhibits no distension. There is no tenderness. There is no rebound and no guarding.  Genitourinary: Vaginal discharge (scant thin white, cervix long and closed, no blood in vault but some dried blood on leg) found.  Musculoskeletal: Normal range of motion.  Neurological: She is alert and oriented to person, place, and time.  Skin: Skin is warm and dry.  Psychiatric: She has a normal mood and affect.   FHR reassuring, + accels and average variability ? Cramping, difficult to trace  MAU Course  Procedures  MDM Results for ERNESTEEN, MIHALIC (MRN [id number]) as of 12/12/2014 00:54  Ref. Range 12/12/2014 00:15  Yeast Wet Prep HPF POC Latest Range: NONE SEEN  NONE SEEN  Trich, Wet Prep Latest Range: NONE SEEN  FEW (A)  Clue Cells Wet Prep HPF POC Latest Range: NONE SEEN  NONE SEEN  WBC, Wet Prep HPF POC Latest Range: NONE SEEN  FEW (A)    Assessment  and Plan  A:  SIUP at 3672w1d       One episode of bleeding, no blood now       Cramping       Trichomonas  P:  Discussed with Dr Tenny Crawoss       Treat with Flagyl 2gm po x 1       Partner to followup with treatment       Hydration       [facility]Azai Gaffin 12/12/2014, 12:07 AM

## 2014-12-12 NOTE — Discharge Instructions (Signed)
Trichomoniasis °Trichomoniasis is an infection caused by an organism called Trichomonas. The infection can affect both women and men. In women, the outer female genitalia and the vagina are affected. In men, the penis is mainly affected, but the prostate and other reproductive organs can also be involved. Trichomoniasis is a sexually transmitted infection (STI) and is most often passed to another person through sexual contact.  °RISK FACTORS °· Having unprotected sexual intercourse. °· Having sexual intercourse with an infected partner. °SIGNS AND SYMPTOMS  °Symptoms of trichomoniasis in women include: °· Abnormal gray-green frothy vaginal discharge. °· Itching and irritation of the vagina. °· Itching and irritation of the area outside the vagina. °Symptoms of trichomoniasis in men include:  °· Penile discharge with or without pain. °· Pain during urination. This results from inflammation of the urethra. °DIAGNOSIS  °Trichomoniasis may be found during a Pap test or physical exam. Your health care provider may use one of the following methods to help diagnose this infection: °· Examining vaginal discharge under a microscope. For men, urethral discharge would be examined. °· Testing the pH of the vagina with a test tape. °· Using a vaginal swab test that checks for the Trichomonas organism. A test is available that provides results within a few minutes. °· Doing a culture test for the organism. This is not usually needed. °TREATMENT  °· You may be given medicine to fight the infection. Women should inform their health care provider if they could be or are pregnant. Some medicines used to treat the infection should not be taken during pregnancy. °· Your health care provider may recommend over-the-counter medicines or creams to decrease itching or irritation. °· Your sexual partner will need to be treated if infected. °HOME CARE INSTRUCTIONS  °· Take medicines only as directed by your health care provider. °· Take  over-the-counter medicine for itching or irritation as directed by your health care provider. °· Do not have sexual intercourse while you have the infection. °· Women should not douche or wear tampons while they have the infection. °· Discuss your infection with your partner. Your partner may have gotten the infection from you, or you may have gotten it from your partner. °· Have your sex partner get examined and treated if necessary. °· Practice safe, informed, and protected sex. °· See your health care provider for other STI testing. °SEEK MEDICAL CARE IF:  °· You still have symptoms after you finish your medicine. °· You develop abdominal pain. °· You have pain when you urinate. °· You have bleeding after sexual intercourse. °· You develop a rash. °· Your medicine makes you sick or makes you throw up (vomit). °MAKE SURE YOU: °· Understand these instructions. °· Will watch your condition. °· Will get help right away if you are not doing well or get worse. °Document Released: 05/10/2001 Document Revised: 03/31/2014 Document Reviewed: 08/26/2013 °ExitCare® Patient Information ©2015 [employer]. This information is not intended to replace advice given to you by your health care provider. Make sure you discuss any questions you have with your health care provider. ° °Sexually Transmitted Disease °A sexually transmitted disease (STD) is a disease or infection often passed to another person during sex. However, STDs can be passed through nonsexual ways. An STD can be passed through: °· Spit (saliva). °· Semen. °· Blood. °· Mucus from the vagina. °· Pee (urine). °HOW CAN I LESSEN MY CHANCES OF GETTING AN STD? °· Use: °¨ Latex condoms. °¨ Water-soluble lubricants with condoms. Do not use petroleum   jelly or oils. °¨ Dental dams. These are small pieces of latex that are used as a barrier during oral sex. °· Avoid having more than one sex partner. °· Do not have sex with someone who has other sex partners. °· Do not have  sex with anyone you do not know or who is at high risk for an STD. °· Avoid risky sex that can break your skin. °· Do not have sex if you have open sores on your mouth or skin. °· Avoid drinking too much alcohol or taking illegal drugs. Alcohol and drugs can affect your good judgment. °· Avoid oral and anal sex acts. °· Get shots (vaccines) for HPV and hepatitis. °· If you are at risk of being infected with HIV, it is advised that you take a certain medicine daily to prevent HIV infection. This is called pre-exposure prophylaxis (PrEP). You may be at risk if: °¨ You are a man who has sex with other men (MSM). °¨ You are attracted to the opposite sex (heterosexual) and are having sex with more than one partner. °¨ You take drugs with a needle. °¨ You have sex with someone who has HIV. °· Talk with your doctor about if you are at high risk of being infected with HIV. If you begin to take PrEP, get tested for HIV first. Get tested every 3 months for as long as you are taking PrEP. °WHAT SHOULD I DO IF I THINK I HAVE AN STD? °· See your doctor. °· Tell your sex partner(s) that you have an STD. They should be tested and treated. °· Do not have sex until your doctor says it is okay. °WHEN SHOULD I GET HELP? °Get help right away if: °· You have bad belly (abdominal) pain. °· You are a man and have puffiness (swelling) or pain in your testicles. °· You are a woman and have puffiness in your vagina. °Document Released: 12/22/2004 Document Revised: 11/19/2013 Document Reviewed: 05/10/2013 °ExitCare® Patient Information ©2015 [employer]. This information is not intended to replace advice given to you by your health care provider. Make sure you discuss any questions you have with your health care provider. ° °

## 2015-01-07 ENCOUNTER — Inpatient Hospital Stay (HOSPITAL_COMMUNITY)
Admission: AD | Admit: 2015-01-07 | Discharge: 2015-01-07 | Disposition: A | Discharge status: Home / Self Care | Payer: Medicaid Other | Source: Ambulatory Visit | Attending: Obstetrics and Gynecology | Admitting: Obstetrics and Gynecology

## 2015-01-07 ENCOUNTER — Encounter (HOSPITAL_COMMUNITY): Payer: Self-pay | Admitting: *Deleted

## 2015-01-07 DIAGNOSIS — Z3A34 34 weeks gestation of pregnancy: Secondary | ICD-10-CM | POA: Diagnosis not present

## 2015-01-07 DIAGNOSIS — O133 Gestational [pregnancy-induced] hypertension without significant proteinuria, third trimester: Secondary | ICD-10-CM | POA: Insufficient documentation

## 2015-01-07 DIAGNOSIS — Z87891 Personal history of nicotine dependence: Secondary | ICD-10-CM | POA: Insufficient documentation

## 2015-01-07 DIAGNOSIS — R42 Dizziness and giddiness: Secondary | ICD-10-CM | POA: Diagnosis present

## 2015-01-07 DIAGNOSIS — Z3A35 35 weeks gestation of pregnancy: Secondary | ICD-10-CM

## 2015-01-07 HISTORY — DX: Morbid (severe) obesity due to excess calories: E66.01

## 2015-01-07 LAB — COMPREHENSIVE METABOLIC PANEL
ALBUMIN: 2.8 g/dL — AB (ref 3.5–5.2)
ALT: 21 U/L (ref 0–35)
AST: 21 U/L (ref 0–37)
Alkaline Phosphatase: 104 U/L (ref 39–117)
Anion gap: 6 (ref 5–15)
BILIRUBIN TOTAL: 0.3 mg/dL (ref 0.3–1.2)
BUN: 6 mg/dL (ref 6–23)
CO2: 23 mmol/L (ref 19–32)
CREATININE: 0.47 mg/dL — AB (ref 0.50–1.10)
Calcium: 8.8 mg/dL (ref 8.4–10.5)
Chloride: 107 mmol/L (ref 96–112)
GFR calc Af Amer: >90 mL/min (ref >90)
GFR calc non Af Amer: >90 mL/min (ref >90)
Glucose, Bld: 135 mg/dL — ABNORMAL HIGH (ref 70–99)
Potassium: 3.4 mmol/L — ABNORMAL LOW (ref 3.5–5.1)
Sodium: 136 mmol/L (ref 135–145)
Total Protein: 6.5 g/dL (ref 6.0–8.3)

## 2015-01-07 LAB — CBC
HEMATOCRIT: 34.1 % — AB (ref 36.0–46.0)
HEMOGLOBIN: 11.8 g/dL — AB (ref 12.0–15.0)
MCH: 28.6 pg (ref 26.0–34.0)
MCHC: 34.6 g/dL (ref 30.0–36.0)
MCV: 82.6 fL (ref 78.0–100.0)
Platelets: 184 10*3/uL (ref 150–400)
RBC: 4.13 MIL/uL (ref 3.87–5.11)
RDW: 14 % (ref 11.5–15.5)
WBC: 10.7 10*3/uL — ABNORMAL HIGH (ref 4.0–10.5)

## 2015-01-07 LAB — URINALYSIS, ROUTINE W REFLEX MICROSCOPIC
Bilirubin Urine: NEGATIVE
Glucose, UA: 100 mg/dL — AB
Hgb urine dipstick: NEGATIVE
KETONES UR: NEGATIVE mg/dL
LEUKOCYTES UA: NEGATIVE
NITRITE: NEGATIVE
PROTEIN: NEGATIVE mg/dL
Specific Gravity, Urine: 1.02 (ref 1.005–1.030)
UROBILINOGEN UA: 0.2 mg/dL (ref 0.0–1.0)
pH: 6.5 (ref 5.0–8.0)

## 2015-01-07 LAB — PROTEIN / CREATININE RATIO, URINE
Creatinine, Urine: 96 mg/dL
PROTEIN CREATININE RATIO: 0.11 (ref 0.00–0.15)
TOTAL PROTEIN, URINE: 11 mg/dL

## 2015-01-07 LAB — URIC ACID: URIC ACID, SERUM: 4.4 mg/dL (ref 2.4–7.0)

## 2015-01-07 LAB — LACTATE DEHYDROGENASE: LDH: 176 U/L (ref 94–250)

## 2015-01-07 NOTE — MAU Note (Signed)
C/o SOB and dizziness since lunchtime today;

## 2015-01-07 NOTE — MAU Note (Signed)
States that she is not sleeping well at night because she can't get comfortable and gets up a lot to void;

## 2015-01-07 NOTE — MAU Provider Note (Signed)
History     CSN: [id number]  Arrival date and time: 01/07/15 1439   First Provider Initiated Contact with Patient 01/07/15 1607      Chief Complaint  Patient presents with  . Shortness of Breath  . Dizziness   HPI   Ms. Dominique Anderson is a 29 y.o. female G2P1001 at 55w6dpresenting with dizziness. She got up from work and felt dizzy with SOB. She denies these symptoms currently, however knew she should be seen because of her BP. She has a history of pregnancy induced hypertension; not on medication She currently denies dizziness or SOB.   She is scheduled for cesarean section in March.   OB History    Gravida Para Term Preterm AB TAB SAB Ectopic Multiple Living   '2 1 1       1      ' Past Medical History  Diagnosis Date  . Gestational diabetes   . Hypertension   . Pregnancy induced hypertension   . Morbid obesity     Past Surgical History  Procedure Laterality Date  . Cesarean section      Family History  Problem Relation Age of Onset  . Hypertension Mother   . Hypertension Sister   . Hypertension Maternal Uncle   . Diabetes Maternal Uncle   . Diabetes Maternal Grandmother   . Hypertension Maternal Grandmother   . Alcohol abuse Neg Hx   . Arthritis Neg Hx   . Asthma Neg Hx   . Birth defects Neg Hx   . Cancer Neg Hx   . COPD Neg Hx   . Depression Neg Hx   . Drug abuse Neg Hx   . Early death Neg Hx   . Hearing loss Neg Hx   . Heart disease Neg Hx   . Hyperlipidemia Neg Hx   . Kidney disease Neg Hx   . Learning disabilities Neg Hx   . Mental illness Neg Hx   . Mental retardation Neg Hx   . Miscarriages / Stillbirths Neg Hx   . Stroke Neg Hx   . Vision loss Neg Hx   . Varicose Veins Neg Hx     History  Substance Use Topics  . Smoking status: Former Smoker    Quit date: 02/06/2014  . Smokeless tobacco: Not on file  . Alcohol Use: No    Allergies:  Allergies  Allergen Reactions  . Penicillins Hives    Prescriptions prior to admission   Medication Sig Dispense Refill Last Dose  . prenatal vitamin w/FE, FA (NATACHEW) 29-1 MG CHEW chewable tablet Chew 1 tablet by mouth daily at 12 noon. 30 tablet 0 01/07/2015 at Unknown time   Results for orders placed or performed during the hospital encounter of 01/07/15 (from the past 48 hour(s))  Urinalysis, Routine w reflex microscopic     Status: Abnormal   Collection Time: 01/07/15  2:50 PM  Result Value Ref Range   Color, Urine YELLOW YELLOW   APPearance CLEAR CLEAR   Specific Gravity, Urine 1.020 1.005 - 1.030   pH 6.5 5.0 - 8.0   Glucose, UA 100 (A) NEGATIVE mg/dL   Hgb urine dipstick NEGATIVE NEGATIVE   Bilirubin Urine NEGATIVE NEGATIVE   Ketones, ur NEGATIVE NEGATIVE mg/dL   Protein, ur NEGATIVE NEGATIVE mg/dL   Urobilinogen, UA 0.2 0.0 - 1.0 mg/dL   Nitrite NEGATIVE NEGATIVE   Leukocytes, UA NEGATIVE NEGATIVE    Comment: MICROSCOPIC NOT DONE ON URINES WITH NEGATIVE PROTEIN, BLOOD, LEUKOCYTES, NITRITE, OR  GLUCOSE <1000 mg/dL.   Results for orders placed or performed during the hospital encounter of 01/07/15 (from the past 72 hour(s))  Urinalysis, Routine w reflex microscopic     Status: Abnormal   Collection Time: 01/07/15  2:50 PM  Result Value Ref Range   Color, Urine YELLOW YELLOW   APPearance CLEAR CLEAR   Specific Gravity, Urine 1.020 1.005 - 1.030   pH 6.5 5.0 - 8.0   Glucose, UA 100 (A) NEGATIVE mg/dL   Hgb urine dipstick NEGATIVE NEGATIVE   Bilirubin Urine NEGATIVE NEGATIVE   Ketones, ur NEGATIVE NEGATIVE mg/dL   Protein, ur NEGATIVE NEGATIVE mg/dL   Urobilinogen, UA 0.2 0.0 - 1.0 mg/dL   Nitrite NEGATIVE NEGATIVE   Leukocytes, UA NEGATIVE NEGATIVE    Comment: MICROSCOPIC NOT DONE ON URINES WITH NEGATIVE PROTEIN, BLOOD, LEUKOCYTES, NITRITE, OR GLUCOSE <1000 mg/dL.  Protein / creatinine ratio, urine     Status: None   Collection Time: 01/07/15  2:50 PM  Result Value Ref Range   Creatinine, Urine 96.00 mg/dL   Total Protein, Urine 11 mg/dL    Comment: NO  NORMAL RANGE ESTABLISHED FOR THIS TEST   Protein Creatinine Ratio 0.11 0.00 - 0.15  CBC     Status: Abnormal   Collection Time: 01/07/15  3:56 PM  Result Value Ref Range   WBC 10.7 (H) 4.0 - 10.5 K/uL   RBC 4.13 3.87 - 5.11 MIL/uL   Hemoglobin 11.8 (L) 12.0 - 15.0 g/dL   HCT 34.1 (L) 36.0 - 46.0 %   MCV 82.6 78.0 - 100.0 fL   MCH 28.6 26.0 - 34.0 pg   MCHC 34.6 30.0 - 36.0 g/dL   RDW 14.0 11.5 - 15.5 %   Platelets 184 150 - 400 K/uL  Comprehensive metabolic panel     Status: Abnormal   Collection Time: 01/07/15  3:56 PM  Result Value Ref Range   Sodium 136 135 - 145 mmol/L   Potassium 3.4 (L) 3.5 - 5.1 mmol/L   Chloride 107 96 - 112 mmol/L   CO2 23 19 - 32 mmol/L   Glucose, Bld 135 (H) 70 - 99 mg/dL   BUN 6 6 - 23 mg/dL   Creatinine, Ser 0.47 (L) 0.50 - 1.10 mg/dL   Calcium 8.8 8.4 - 10.5 mg/dL   Total Protein 6.5 6.0 - 8.3 g/dL   Albumin 2.8 (L) 3.5 - 5.2 g/dL   AST 21 0 - 37 U/L   ALT 21 0 - 35 U/L   Alkaline Phosphatase 104 39 - 117 U/L   Total Bilirubin 0.3 0.3 - 1.2 mg/dL   GFR calc non Af Amer >90 >90 mL/min   GFR calc Af Amer >90 >90 mL/min    Comment: (NOTE) The eGFR has been calculated using the CKD EPI equation. This calculation has not been validated in all clinical situations. eGFR's persistently <90 mL/min signify possible Chronic Kidney Disease.    Anion gap 6 5 - 15  Uric acid     Status: None   Collection Time: 01/07/15  3:56 PM  Result Value Ref Range   Uric Acid, Serum 4.4 2.4 - 7.0 mg/dL  Lactate dehydrogenase     Status: None   Collection Time: 01/07/15  3:56 PM  Result Value Ref Range   LDH 176 94 - 250 U/L      Review of Systems  Constitutional: Negative for fever.  Eyes: Negative for blurred vision.  Respiratory: Negative for shortness of breath.   Cardiovascular:  Positive for leg swelling. Negative for chest pain.  Gastrointestinal: Negative for nausea.  Neurological: Negative for headaches.   Physical Exam   Blood pressure 138/86,  pulse 101, temperature 97.4 F (36.3 C), temperature source Oral, resp. rate 18, height 5' (1.524 m), weight 140.615 kg (310 lb), last menstrual period 05/17/2014, SpO2 100 %.  Physical Exam  Constitutional: She is oriented to person, place, and time. She appears well-developed and well-nourished. No distress.  Obese   HENT:  Head: Normocephalic.  Eyes: Pupils are equal, round, and reactive to light.  Neck: Neck supple.  Cardiovascular: Normal rate and normal heart sounds.   Respiratory: Effort normal and breath sounds normal. No respiratory distress.  GI: Soft. She exhibits no distension. There is no tenderness.  Musculoskeletal:       Right ankle: She exhibits swelling (Non-Pitting ).       Left ankle: She exhibits swelling (Non-Pitting ).  Neurological: She is alert and oriented to person, place, and time. She has normal reflexes.  Skin: Skin is warm. She is not diaphoretic.  Psychiatric: Her behavior is normal.    Fetal Tracing: Baseline: 130 bpm  Variability: Moderate Accelerations: 15x15 Decelerations: None Toco: None   MAU Course  Procedures  None  MDM PIH labs> CBC>CMET>Uric acid>LDH>protein creatine ratio NST UA  Discussed patient with Dr. Harrington Challenger at [id number].  Assessment and Plan   A:  1. Pregnancy induced hypertension, third trimester    P:  Discharge home in stable condition Preeclampsia precautions Call your Dr. To schedule a follow up visit Return to MAU if symptoms worsen   Darrelyn Hillock Dominique Statzer, NP 01/07/2015 8:06 PM

## 2015-01-07 NOTE — Discharge Instructions (Signed)

## 2015-01-29 ENCOUNTER — Encounter (HOSPITAL_COMMUNITY): Payer: Self-pay

## 2015-01-30 ENCOUNTER — Encounter (HOSPITAL_COMMUNITY): Payer: Self-pay

## 2015-01-30 ENCOUNTER — Encounter (HOSPITAL_COMMUNITY)
Admission: RE | Admit: 2015-01-30 | Discharge: 2015-01-30 | Disposition: A | Discharge status: Home / Self Care | Payer: No Typology Code available for payment source | Source: Ambulatory Visit | Attending: Obstetrics & Gynecology | Admitting: Obstetrics & Gynecology

## 2015-01-30 LAB — CBC
HCT: 36.8 % (ref 36.0–46.0)
Hemoglobin: 12.2 g/dL (ref 12.0–15.0)
MCH: 27.1 pg (ref 26.0–34.0)
MCHC: 33.2 g/dL (ref 30.0–36.0)
MCV: 81.8 fL (ref 78.0–100.0)
PLATELETS: 180 10*3/uL (ref 150–400)
RBC: 4.5 MIL/uL (ref 3.87–5.11)
RDW: 14.3 % (ref 11.5–15.5)
WBC: 9.3 10*3/uL (ref 4.0–10.5)

## 2015-01-30 NOTE — Patient Instructions (Addendum)
   Your procedure is scheduled on:  Monday, March 7  [facility] through [facility] at: 6 AM Pick up the phone at the desk and dial [phone] and inform us of your arrival.  Please call this number if you have any problems the morning of surgery: [phone]  Remember: Do not eat or drink after midnight: Sunday Take these medicines the morning of surgery with a SIP OF WATER:  none  Do not wear jewelry, make-up, or FINGER nail polish No metal in your hair or on your body. Do not wear lotions, powders, perfumes.  You may wear deodorant.  Do not bring valuables to the hospital. Contacts, dentures or bridgework may not be worn into surgery.  Leave suitcase in the car. After Surgery it may be brought to your room. For patients being admitted to the hospital, checkout time is 11:00am the day of discharge.  Home with mother Lucinda cell [phone].

## 2015-01-31 LAB — RPR: RPR: NONREACTIVE

## 2015-02-02 ENCOUNTER — Encounter (HOSPITAL_COMMUNITY): Payer: Self-pay

## 2015-02-02 ENCOUNTER — Inpatient Hospital Stay (HOSPITAL_COMMUNITY): Payer: No Typology Code available for payment source | Admitting: Anesthesiology

## 2015-02-02 ENCOUNTER — Inpatient Hospital Stay (HOSPITAL_COMMUNITY)
Admission: RE | Admit: 2015-02-02 | Discharge: 2015-02-04 | DRG: 765 | Disposition: A | Discharge status: Home / Self Care | Payer: No Typology Code available for payment source | Source: Ambulatory Visit | Attending: Obstetrics & Gynecology | Admitting: Obstetrics & Gynecology

## 2015-02-02 ENCOUNTER — Encounter (HOSPITAL_COMMUNITY)
Admission: RE | Disposition: A | Discharge status: Home / Self Care | Payer: Self-pay | Source: Ambulatory Visit | Attending: Obstetrics & Gynecology

## 2015-02-02 DIAGNOSIS — Z6841 Body Mass Index (BMI) 40.0 and over, adult: Secondary | ICD-10-CM

## 2015-02-02 DIAGNOSIS — N858 Other specified noninflammatory disorders of uterus: Secondary | ICD-10-CM | POA: Diagnosis present

## 2015-02-02 DIAGNOSIS — Z98891 History of uterine scar from previous surgery: Secondary | ICD-10-CM

## 2015-02-02 DIAGNOSIS — Z3A38 38 weeks gestation of pregnancy: Secondary | ICD-10-CM | POA: Diagnosis present

## 2015-02-02 DIAGNOSIS — Z87891 Personal history of nicotine dependence: Secondary | ICD-10-CM

## 2015-02-02 DIAGNOSIS — O99214 Obesity complicating childbirth: Secondary | ICD-10-CM | POA: Diagnosis present

## 2015-02-02 DIAGNOSIS — O3421 Maternal care for scar from previous cesarean delivery: Secondary | ICD-10-CM | POA: Diagnosis present

## 2015-02-02 DIAGNOSIS — E669 Obesity, unspecified: Secondary | ICD-10-CM | POA: Diagnosis present

## 2015-02-02 DIAGNOSIS — Z8632 Personal history of gestational diabetes: Secondary | ICD-10-CM

## 2015-02-02 HISTORY — DX: Other specified postprocedural states: R11.2

## 2015-02-02 HISTORY — DX: Other specified postprocedural states: Z98.890

## 2015-02-02 LAB — GLUCOSE, CAPILLARY: Glucose-Capillary: 113 mg/dL — ABNORMAL HIGH (ref 70–99)

## 2015-02-02 LAB — PREPARE RBC (CROSSMATCH)

## 2015-02-02 SURGERY — Surgical Case
Anesthesia: Epidural | Site: Abdomen

## 2015-02-02 MED ORDER — ACETAMINOPHEN 500 MG PO TABS
1000.0000 mg | ORAL_TABLET | Freq: Once | ORAL | Status: AC
  Administered 2015-02-02: 1000 mg via ORAL

## 2015-02-02 MED ORDER — MEPERIDINE HCL 25 MG/ML IJ SOLN: 6.2500 mg | INTRAMUSCULAR | Status: DC | PRN

## 2015-02-02 MED ORDER — LIDOCAINE HCL (PF) 1 % IJ SOLN
INTRAMUSCULAR | Status: AC
  Filled 2015-02-02: qty 5

## 2015-02-02 MED ORDER — OXYTOCIN 40 UNITS IN LACTATED RINGERS INFUSION - SIMPLE MED: 62.5000 mL/h | INTRAVENOUS | Status: AC

## 2015-02-02 MED ORDER — ONDANSETRON HCL 4 MG/2ML IJ SOLN
INTRAMUSCULAR | Status: AC
  Filled 2015-02-02: qty 2

## 2015-02-02 MED ORDER — 0.9 % SODIUM CHLORIDE (POUR BTL) OPTIME
TOPICAL | Status: DC | PRN
  Administered 2015-02-02: 1000 mL

## 2015-02-02 MED ORDER — FENTANYL CITRATE 0.05 MG/ML IJ SOLN: 25.0000 ug | INTRAMUSCULAR | Status: DC | PRN

## 2015-02-02 MED ORDER — ONDANSETRON HCL 4 MG/2ML IJ SOLN: 4.0000 mg | INTRAMUSCULAR | Status: DC | PRN

## 2015-02-02 MED ORDER — PHENYLEPHRINE 8 MG IN D5W 100 ML (0.08MG/ML) PREMIX OPTIME
INJECTION | INTRAVENOUS | Status: DC | PRN
  Administered 2015-02-02: 60 ug/min via INTRAVENOUS
  Administered 2015-02-02: 20 ug/min via INTRAVENOUS

## 2015-02-02 MED ORDER — OXYCODONE-ACETAMINOPHEN 5-325 MG PO TABS
2.0000 | ORAL_TABLET | ORAL | Status: DC | PRN
  Administered 2015-02-03: 2 via ORAL
  Filled 2015-02-02: qty 2

## 2015-02-02 MED ORDER — DIPHENHYDRAMINE HCL 50 MG/ML IJ SOLN: 12.5000 mg | INTRAMUSCULAR | Status: DC | PRN

## 2015-02-02 MED ORDER — OXYCODONE-ACETAMINOPHEN 5-325 MG PO TABS
1.0000 | ORAL_TABLET | ORAL | Status: DC | PRN
  Administered 2015-02-04: 1 via ORAL
  Filled 2015-02-02: qty 1

## 2015-02-02 MED ORDER — PHENYLEPHRINE HCL 10 MG/ML IJ SOLN
INTRAMUSCULAR | Status: DC | PRN
  Administered 2015-02-02 (×2): 80 ug via INTRAVENOUS

## 2015-02-02 MED ORDER — GENTAMICIN SULFATE 40 MG/ML IJ SOLN: 5.0000 mg/kg | INTRAVENOUS | Status: DC

## 2015-02-02 MED ORDER — NALBUPHINE HCL 10 MG/ML IJ SOLN: 5.0000 mg | INTRAMUSCULAR | Status: DC | PRN

## 2015-02-02 MED ORDER — WITCH HAZEL-GLYCERIN EX PADS
1.0000 | MEDICATED_PAD | CUTANEOUS | Status: DC | PRN
Start: 2015-02-02 — End: 2015-02-04

## 2015-02-02 MED ORDER — MORPHINE SULFATE 0.5 MG/ML IJ SOLN
INTRAMUSCULAR | Status: AC
  Filled 2015-02-02: qty 10

## 2015-02-02 MED ORDER — SCOPOLAMINE 1 MG/3DAYS TD PT72: 1.0000 | MEDICATED_PATCH | Freq: Once | TRANSDERMAL | Status: DC

## 2015-02-02 MED ORDER — PHENYLEPHRINE 8 MG IN D5W 100 ML (0.08MG/ML) PREMIX OPTIME
INJECTION | INTRAVENOUS | Status: AC
  Filled 2015-02-02: qty 100

## 2015-02-02 MED ORDER — SIMETHICONE 80 MG PO CHEW
80.0000 mg | CHEWABLE_TABLET | Freq: Three times a day (TID) | ORAL | Status: DC
  Administered 2015-02-02 – 2015-02-03 (×4): 80 mg via ORAL
  Filled 2015-02-02 (×3): qty 1

## 2015-02-02 MED ORDER — LIDOCAINE HCL (PF) 1 % IJ SOLN
INTRAMUSCULAR | Status: DC | PRN
  Administered 2015-02-02: 1 mL

## 2015-02-02 MED ORDER — SIMETHICONE 80 MG PO CHEW
80.0000 mg | CHEWABLE_TABLET | ORAL | Status: DC
  Administered 2015-02-02 – 2015-02-03 (×2): 80 mg via ORAL
  Filled 2015-02-02 (×2): qty 1

## 2015-02-02 MED ORDER — SENNOSIDES-DOCUSATE SODIUM 8.6-50 MG PO TABS
2.0000 | ORAL_TABLET | ORAL | Status: DC
  Administered 2015-02-02 – 2015-02-03 (×2): 2 via ORAL
  Filled 2015-02-02 (×2): qty 2

## 2015-02-02 MED ORDER — NALOXONE HCL 0.4 MG/ML IJ SOLN: 0.4000 mg | INTRAMUSCULAR | Status: DC | PRN

## 2015-02-02 MED ORDER — LACTATED RINGERS IV SOLN
INTRAVENOUS | Status: DC
Start: 2015-02-02 — End: 2015-02-02
  Administered 2015-02-02: 08:00:00 via INTRAVENOUS

## 2015-02-02 MED ORDER — DIPHENHYDRAMINE HCL 25 MG PO CAPS: 25.0000 mg | ORAL_CAPSULE | Freq: Four times a day (QID) | ORAL | Status: DC | PRN

## 2015-02-02 MED ORDER — SIMETHICONE 80 MG PO CHEW: 80.0000 mg | CHEWABLE_TABLET | ORAL | Status: DC | PRN

## 2015-02-02 MED ORDER — LACTATED RINGERS IV SOLN
Freq: Once | INTRAVENOUS | Status: AC
Start: 2015-02-02 — End: 2015-02-02
  Administered 2015-02-02 (×3): via INTRAVENOUS

## 2015-02-02 MED ORDER — SODIUM BICARBONATE 8.4 % IV SOLN
INTRAVENOUS | Status: DC | PRN
  Administered 2015-02-02: 5 mL via EPIDURAL
  Administered 2015-02-02: 1 mL via EPIDURAL
  Administered 2015-02-02: 4 mL via EPIDURAL

## 2015-02-02 MED ORDER — CLINDAMYCIN PHOSPHATE 900 MG/50ML IV SOLN: 900.0000 mg | INTRAVENOUS | Status: DC

## 2015-02-02 MED ORDER — GENTAMICIN SULFATE 40 MG/ML IJ SOLN
INTRAVENOUS | Status: AC
  Administered 2015-02-02: 116 mL via INTRAVENOUS
  Filled 2015-02-02: qty 10

## 2015-02-02 MED ORDER — ZOLPIDEM TARTRATE 5 MG PO TABS: 5.0000 mg | ORAL_TABLET | Freq: Every evening | ORAL | Status: DC | PRN

## 2015-02-02 MED ORDER — LACTATED RINGERS IV SOLN
INTRAVENOUS | Status: DC
  Administered 2015-02-02: 16:00:00 via INTRAVENOUS

## 2015-02-02 MED ORDER — PHENYLEPHRINE 40 MCG/ML (10ML) SYRINGE FOR IV PUSH (FOR BLOOD PRESSURE SUPPORT)
PREFILLED_SYRINGE | INTRAVENOUS | Status: AC
  Filled 2015-02-02: qty 10

## 2015-02-02 MED ORDER — SODIUM CHLORIDE 0.9 % IJ SOLN: 3.0000 mL | INTRAMUSCULAR | Status: DC | PRN

## 2015-02-02 MED ORDER — ACETAMINOPHEN 500 MG PO TABS
ORAL_TABLET | ORAL | Status: AC
  Filled 2015-02-02: qty 2

## 2015-02-02 MED ORDER — FENTANYL CITRATE 0.05 MG/ML IJ SOLN
INTRAMUSCULAR | Status: AC
  Filled 2015-02-02: qty 2

## 2015-02-02 MED ORDER — SCOPOLAMINE 1 MG/3DAYS TD PT72
1.0000 | MEDICATED_PATCH | Freq: Once | TRANSDERMAL | Status: DC
  Administered 2015-02-02: 1.5 mg via TRANSDERMAL

## 2015-02-02 MED ORDER — ONDANSETRON HCL 4 MG/2ML IJ SOLN
INTRAMUSCULAR | Status: DC | PRN
  Administered 2015-02-02: 4 mg via INTRAVENOUS

## 2015-02-02 MED ORDER — DIBUCAINE 1 % RE OINT
1.0000 | TOPICAL_OINTMENT | RECTAL | Status: DC | PRN
Start: 2015-02-02 — End: 2015-02-04

## 2015-02-02 MED ORDER — OXYTOCIN 10 UNIT/ML IJ SOLN
40.0000 [IU] | INTRAMUSCULAR | Status: DC | PRN
  Administered 2015-02-02: 40 [IU] via INTRAVENOUS

## 2015-02-02 MED ORDER — NALBUPHINE HCL 10 MG/ML IJ SOLN: 5.0000 mg | Freq: Once | INTRAMUSCULAR | Status: AC | PRN

## 2015-02-02 MED ORDER — ONDANSETRON HCL 4 MG PO TABS: 4.0000 mg | ORAL_TABLET | ORAL | Status: DC | PRN

## 2015-02-02 MED ORDER — MENTHOL 3 MG MT LOZG: 1.0000 | LOZENGE | OROMUCOSAL | Status: DC | PRN

## 2015-02-02 MED ORDER — IBUPROFEN 600 MG PO TABS
600.0000 mg | ORAL_TABLET | Freq: Four times a day (QID) | ORAL | Status: DC
  Administered 2015-02-02 – 2015-02-04 (×7): 600 mg via ORAL
  Filled 2015-02-02 (×7): qty 1

## 2015-02-02 MED ORDER — SCOPOLAMINE 1 MG/3DAYS TD PT72
MEDICATED_PATCH | TRANSDERMAL | Status: AC
  Administered 2015-02-02: 1.5 mg via TRANSDERMAL
  Filled 2015-02-02: qty 1

## 2015-02-02 MED ORDER — BUPIVACAINE IN DEXTROSE 0.75-8.25 % IT SOLN
INTRATHECAL | Status: AC
  Filled 2015-02-02: qty 2

## 2015-02-02 MED ORDER — GENTAMICIN SULFATE 40 MG/ML IJ SOLN: Freq: Once | INTRAVENOUS | Status: DC

## 2015-02-02 MED ORDER — NALOXONE HCL 1 MG/ML IJ SOLN
1.0000 ug/kg/h | INTRAVENOUS | Status: DC | PRN
  Filled 2015-02-02: qty 2

## 2015-02-02 MED ORDER — ACETAMINOPHEN 325 MG PO TABS
650.0000 mg | ORAL_TABLET | Freq: Four times a day (QID) | ORAL | Status: DC | PRN
  Administered 2015-02-02 – 2015-02-03 (×3): 650 mg via ORAL
  Filled 2015-02-02 (×3): qty 2

## 2015-02-02 MED ORDER — MORPHINE SULFATE (PF) 0.5 MG/ML IJ SOLN
INTRAMUSCULAR | Status: DC | PRN
  Administered 2015-02-02: .8 mg via INTRAVENOUS
  Administered 2015-02-02 (×2): 1 mg via INTRAVENOUS
  Administered 2015-02-02: 2 mg via INTRAVENOUS

## 2015-02-02 MED ORDER — LANOLIN HYDROUS EX OINT: 1.0000 "application " | TOPICAL_OINTMENT | CUTANEOUS | Status: DC | PRN

## 2015-02-02 MED ORDER — TETANUS-DIPHTH-ACELL PERTUSSIS 5-2.5-18.5 LF-MCG/0.5 IM SUSP: 0.5000 mL | Freq: Once | INTRAMUSCULAR | Status: DC

## 2015-02-02 MED ORDER — PRENATAL MULTIVITAMIN CH
1.0000 | ORAL_TABLET | Freq: Every day | ORAL | Status: DC
  Administered 2015-02-03: 1 via ORAL
  Filled 2015-02-02: qty 1

## 2015-02-02 MED ORDER — DIPHENHYDRAMINE HCL 25 MG PO CAPS: 25.0000 mg | ORAL_CAPSULE | ORAL | Status: DC | PRN

## 2015-02-02 MED ORDER — ONDANSETRON HCL 4 MG/2ML IJ SOLN: 4.0000 mg | Freq: Three times a day (TID) | INTRAMUSCULAR | Status: DC | PRN

## 2015-02-02 MED ORDER — OXYTOCIN 10 UNIT/ML IJ SOLN
INTRAMUSCULAR | Status: AC
  Filled 2015-02-02: qty 4

## 2015-02-02 SURGICAL SUPPLY — 43 items
APL SKNCLS STERI-STRIP NONHPOA (GAUZE/BANDAGES/DRESSINGS) ×1
BENZOIN TINCTURE PRP APPL 2/3 (GAUZE/BANDAGES/DRESSINGS) ×3 IMPLANT
CLAMP CORD UMBIL (MISCELLANEOUS) IMPLANT
CLOSURE WOUND 1/2 X4 (GAUZE/BANDAGES/DRESSINGS) ×1
CLOTH BEACON ORANGE TIMEOUT ST (SAFETY) ×3 IMPLANT
COVER LIGHT HANDLE  1/PK (MISCELLANEOUS) ×2
COVER LIGHT HANDLE 1/PK (MISCELLANEOUS) IMPLANT
DRAPE SHEET LG 3/4 BI-LAMINATE (DRAPES) IMPLANT
DRSG OPSITE POSTOP 4X10 (GAUZE/BANDAGES/DRESSINGS) ×3 IMPLANT
DURAPREP 26ML APPLICATOR (WOUND CARE) ×3 IMPLANT
ELECT REM PT RETURN 9FT ADLT (ELECTROSURGICAL) ×3
ELECTRODE REM PT RTRN 9FT ADLT (ELECTROSURGICAL) ×1 IMPLANT
EXTRACTOR VACUUM BELL STYLE (SUCTIONS) ×2 IMPLANT
GLOVE BIO SURGEON STRL SZ 6.5 (GLOVE) ×2 IMPLANT
GLOVE BIO SURGEONS STRL SZ 6.5 (GLOVE) ×1
GLOVE BIOGEL PI IND STRL 7.0 (GLOVE) ×1 IMPLANT
GLOVE BIOGEL PI INDICATOR 7.0 (GLOVE) ×2
GOWN STRL REUS W/TWL LRG LVL3 (GOWN DISPOSABLE) ×9 IMPLANT
KIT ABG SYR 3ML LUER SLIP (SYRINGE) IMPLANT
LIQUID BAND (GAUZE/BANDAGES/DRESSINGS) ×2 IMPLANT
NDL HYPO 25X5/8 SAFETYGLIDE (NEEDLE) IMPLANT
NEEDLE HYPO 25X5/8 SAFETYGLIDE (NEEDLE) IMPLANT
NS IRRIG 1000ML POUR BTL (IV SOLUTION) ×3 IMPLANT
PACK C SECTION WH (CUSTOM PROCEDURE TRAY) ×3 IMPLANT
PAD OB MATERNITY 4.3X12.25 (PERSONAL CARE ITEMS) ×3 IMPLANT
RETAINER VISCERAL (MISCELLANEOUS) ×2 IMPLANT
RTRCTR C-SECT PINK 25CM LRG (MISCELLANEOUS) ×3 IMPLANT
STAPLER VISISTAT 35W (STAPLE) IMPLANT
STRIP CLOSURE SKIN 1/2X4 (GAUZE/BANDAGES/DRESSINGS) ×2 IMPLANT
SUT CHROMIC 0 CT 1 (SUTURE) ×4 IMPLANT
SUT CHROMIC 2 0 CT 1 (SUTURE) ×3 IMPLANT
SUT CHROMIC 3 0 SH 27 (SUTURE) ×3 IMPLANT
SUT MNCRL 0 VIOLET CTX 36 (SUTURE) ×2 IMPLANT
SUT MONOCRYL 0 CTX 36 (SUTURE) ×4
SUT PDS AB 0 CTX 36 PDP370T (SUTURE) ×4 IMPLANT
SUT PLAIN 2 0 (SUTURE)
SUT PLAIN 2 0 XLH (SUTURE) ×2 IMPLANT
SUT PLAIN ABS 2-0 CT1 27XMFL (SUTURE) IMPLANT
SUT VIC AB 0 CT1 27 (SUTURE)
SUT VIC AB 0 CT1 27XBRD ANBCTR (SUTURE) IMPLANT
SUT VIC AB 4-0 KS 27 (SUTURE) ×3 IMPLANT
TOWEL OR 17X24 6PK STRL BLUE (TOWEL DISPOSABLE) ×3 IMPLANT
TRAY FOLEY CATH 14FR (SET/KITS/TRAYS/PACK) ×3 IMPLANT

## 2015-02-02 NOTE — Anesthesia Preprocedure Evaluation (Addendum)
Anesthesia Evaluation  Patient identified by MRN, date of birth, ID band Patient awake    Reviewed: Allergy & Precautions, H&P , Patient's Chart, lab work & pertinent test results  Airway Mallampati: IV  TM Distance: >3 FB Neck ROM: full    Dental no notable dental hx.    Pulmonary former smoker,  breath sounds clear to auscultation  Pulmonary exam normal       Cardiovascular Exercise Tolerance: Good hypertension, Rhythm:regular Rate:Normal     Neuro/Psych    GI/Hepatic   Endo/Other  diabetesMorbid obesity  Renal/GU      Musculoskeletal   Abdominal   Peds  Hematology   Anesthesia Other Findings No PIH or DM this pregnancy  Reproductive/Obstetrics                            Anesthesia Physical Anesthesia Plan  ASA: III  Anesthesia Plan: Spinal, Epidural and Combined Spinal and Epidural   Post-op Pain Management:    Induction:   Airway Management Planned:   Additional Equipment:   Intra-op Plan:   Post-operative Plan:   Informed Consent: I have reviewed the patients History and Physical, chart, labs and discussed the procedure including the risks, benefits and alternatives for the proposed anesthesia with the patient or authorized representative who has indicated his/her understanding and acceptance.   Dental Advisory Given  Plan Discussed with: CRNA  Anesthesia Plan Comments: (Lab work confirmed with CRNA in room. Platelets okay. Discussed spinal anesthetic, and patient consents to the procedure:  included risk of possible headache,backache, failed block, allergic reaction, and nerve injury. This patient was asked if she had any questions or concerns before the procedure started. )        Anesthesia Quick Evaluation

## 2015-02-02 NOTE — Transfer of Care (Signed)
Immediate Anesthesia Transfer of Care Note  Patient: Dominique Anderson  Procedure(s) Performed: Procedure(s): CESAREAN SECTION (N/A)  Patient Location: PACU  Anesthesia Type:Regional  Level of Consciousness: awake, alert  and oriented  Airway & Oxygen Therapy: Patient Spontanous Breathing  Post-op Assessment: Report given to RN  Post vital signs: Reviewed  Last Vitals:  Filed Vitals:   02/02/15 0601  BP: 136/94  Pulse: 101  Temp: 36.4 C  Resp: 22    Complications: No apparent anesthesia complications

## 2015-02-02 NOTE — Addendum Note (Signed)
Addendum  created 02/02/15 1255 by Jhonnie GarnerBeth M Tevon Berhane, CRNA   Modules edited: Notes Section   Notes Section:  File: [id number]

## 2015-02-02 NOTE — Anesthesia Postprocedure Evaluation (Signed)
Anesthesia Post Note  Patient: Dominique ReedyAlecia A Engelken  Procedure(s) Performed: Procedure(s) (LRB): CESAREAN SECTION (N/A)  Anesthesia type: Epidural  Patient location: Mother/Baby  Post pain: Pain level controlled  Post assessment: Post-op Vital signs reviewed  Last Vitals:  Filed Vitals:   02/02/15 1100  BP: 128/87  Pulse: 92  Temp: 36.7 C  Resp: 20    Post vital signs: Reviewed  Level of consciousness: awake  Complications: No apparent anesthesia complications

## 2015-02-02 NOTE — Anesthesia Procedure Notes (Signed)
Epidural Patient location during procedure: OR  Preanesthetic Checklist Completed: patient identified, site marked, surgical consent, pre-op evaluation, timeout performed, IV checked, risks and benefits discussed and monitors and equipment checked  Epidural Patient position: sitting Prep: site prepped and draped and DuraPrep Patient monitoring: continuous pulse ox and blood pressure Approach: midline Location: L3-L4 Injection technique: LOR air  Needle:  Needle type: Tuohy  Needle gauge: 17 G Needle length: 9 cm and 9 Needle insertion depth: 9 cm Catheter type: closed end flexible Catheter size: 19 Gauge Catheter at skin depth: 15 cm Test dose: negative  Assessment Sensory level: T4 Events: blood not aspirated, injection not painful, no injection resistance, negative IV test and no paresthesia  Additional Notes Spinal Dosage in OR; Sprotte thru tuohy after LOR  Bupivicaine ml       1.2 PFMS04   mcg        100

## 2015-02-02 NOTE — Anesthesia Postprocedure Evaluation (Signed)
  Anesthesia Post-op Note  Patient: Dominique ReedyAlecia A Niziolek  Procedure(s) Performed: Procedure(s): CESAREAN SECTION (N/A)  Patient is awake, responsive, moving her legs, and has signs of resolution of her numbness. Pain and nausea are reasonably well controlled. Vital signs are stable and clinically acceptable. Oxygen saturation is clinically acceptable. There are no apparent anesthetic complications at this time. Patient is ready for discharge.

## 2015-02-02 NOTE — H&P (Signed)
Dominique Anderson is a 29 y.o. female presenting for scheduled elective repeat -section. Maternal Medical History:  Reason for admission: Scheduled cesarean delivery  Contractions: Frequency: rare.    Fetal activity: Perceived fetal activity is normal.   Last perceived fetal movement was within the past 12 hours.    Prenatal complications: no prenatal complications Prenatal Complications - Diabetes: none.    OB History    Gravida Para Term Preterm AB TAB SAB Ectopic Multiple Living   2 1 1       1      Past Medical History  Diagnosis Date  . Gestational diabetes   . Morbid obesity   . Pregnancy induced hypertension 2011    resolved with delivery  . PONV (postoperative nausea and vomiting)    Past Surgical History  Procedure Laterality Date  . Cesarean section  2011   Family History: family history includes Diabetes in her maternal grandmother and maternal uncle; Hypertension in her maternal grandmother, maternal uncle, mother, and sister. There is no history of Alcohol abuse, Arthritis, Asthma, Birth defects, Cancer, COPD, Depression, Drug abuse, Early death, Hearing loss, Heart disease, Hyperlipidemia, Kidney disease, Learning disabilities, Mental illness, Mental retardation, Miscarriages / Stillbirths, Stroke, Vision loss, or Varicose Veins. Social History:  reports that she quit smoking about a year ago. Her smoking use included Cigarettes. She has a 5 pack-year smoking history. She has never used smokeless tobacco. She reports that she does not drink alcohol or use illicit drugs.   Prenatal Transfer Tool  Maternal Diabetes: No Genetic Screening: Normal Maternal Ultrasounds/Referrals: Normal Fetal Ultrasounds or other Referrals:  None Maternal Substance Abuse:  No Significant Maternal Medications:  None Significant Maternal Lab Results:  None Other Comments:  None  Review of Systems  Constitutional: Negative.   HENT: Negative.   Eyes: Negative.   Respiratory: Negative.    Cardiovascular: Negative.   Gastrointestinal: Negative.   Genitourinary: Negative.   Skin: Negative.   Neurological: Negative.   Endo/Heme/Allergies: Negative.   All other systems reviewed and are negative.     Blood pressure 136/94, pulse 101, temperature 97.5 F (36.4 C), temperature source Oral, resp. rate 22, last menstrual period 05/17/2014, SpO2 98 %. Maternal Exam:  Abdomen: Patient reports no abdominal tenderness. Surgical scars: low transverse.   Fundal height is 39 cm.   Estimated fetal weight is 3200 grams.   Fetal presentation: vertex  Introitus: Normal vulva. Normal vagina.  Cervix: not evaluated.   Fetal Exam Fetal Monitor Review: Mode: hand-held doppler probe.   Baseline rate: 135.  Variability: moderate (6-25 bpm).   Pattern: accelerations present and no decelerations.    Fetal State Assessment: Category I - tracings are normal.     Physical Exam  Nursing note and vitals reviewed. Constitutional: She is oriented to person, place, and time. She appears well-developed and well-nourished.  HENT:  Head: Normocephalic and atraumatic.  Cardiovascular: Normal rate and regular rhythm.   GI: Soft. Bowel sounds are normal.  Genitourinary: Vagina normal and uterus normal.  Musculoskeletal: Normal range of motion.  Neurological: She is alert and oriented to person, place, and time. She has normal reflexes.  Skin: Skin is warm.    Prenatal labs: ABO, Rh: --/--/O NEG (03/04 1335) Antibody: POS (03/04 1335) Rubella: Immune (09/09 0000) RPR: Non Reactive (03/04 1335)  HBsAg: Negative (09/09 0000)  HIV: Non-reactive (09/09 0000)  GBS:     Assessment/Plan: 29 yo P1 for elective repeat Cesarean delivery Clinda / Genta for abx prophylaxis  Dominique Anderson 02/02/2015, 7:34 AM

## 2015-02-02 NOTE — Brief Op Note (Signed)
02/02/2015  9:09 AM  PATIENT:  Dominique Anderson  29 y.o. female  PRE-OPERATIVE DIAGNOSIS:  REPEAT/GESTATIONAL DIABETES HYPERTENSION   POST-OPERATIVE DIAGNOSIS:  REPEAT/GESTATIONAL DIABETES HYPERTENSION   PROCEDURE:  Procedure(s): CESAREAN SECTION (N/A)  SURGEON:  Surgeon(s) and Role:    * Essie HartWalda Court Gracia, MD - Primary    * Philip AspenSidney Callahan, DO - Assisting  PHYSICIAN ASSISTANT:   ASSISTANTSPhilip Aspen: CALLAHAN, SIDNEY   ANESTHESIA:   epidural and spinal  EBL:  Total I/O In: 3000 [I.V.:3000] Out: 900 [Urine:100; Blood:800]  BLOOD ADMINISTERED:none  DRAINS: Urinary Catheter (Foley)   LOCAL MEDICATIONS USED:  NONE  SPECIMEN:  No Specimen  DISPOSITION OF SPECIMEN:  N/A  COUNTS:  YES  TOURNIQUET:  * No tourniquets in log *  DICTATION: .Note written in EPIC  PLAN OF CARE: Admit to inpatient   PATIENT DISPOSITION:  PACU - hemodynamically stable.   Delay start of Pharmacological VTE agent (>24hrs) due to surgical blood loss or risk of bleeding: not applicable  Darell Saputo STACIA

## 2015-02-02 NOTE — Op Note (Signed)
Cesarean Section Procedure Note  Indications: previous uterine incision kerr x1   Pre-operative Diagnosis: 38 week 4 day pregnancy, prior LTCS with Chronic hypertension  Post-operative Diagnosis: same  Surgeon: Wynonia Hazard   Assistants: Philip Aspen  Anesthesia:  Combined Epidural and Spinal anesthesia  ASA Class: 2  Procedure Details   The patient was counseled about the risks, benefits, complications of the cesarean section. The patient concurred with the proposed plan, giving informed consent.  The site of surgery properly noted/marked. The patient was taken to Operating Room # 9, identified as Dominique Anderson and the procedure verified as C-Section Delivery. A Time Out was held and the above information confirmed.  After epidural was found to adequate , the patient was placed in the dorsal supine position with a leftward tilt, draped and prepped in the usual sterile manner. A Pfannenstiel incision was made and carried down through the subcutaneous tissue to the fascia.  The fascia was incised in the midline and the fascial incision was extended laterally with the bovie.  The superior aspect of the fascial incision was grasped with Coker clamps x2, tented up and the rectus muscles dissected off sharply with the scalpel. The rectus was then dissected off with blunt dissection and Mayo scissors inferiorly. The rectus muscles were separated in the midline. The abdominal peritoneum was identified, tented up, entered bluntly using surgeons fingers  and the incision was extended superiorly and inferiorly with good visualization of the bladder. The Alexis retractor was then deployed. The vesicouterine peritoneum was identified, tented up, entered sharply with Metzenbaum scissors, and the bladder flap was created digitally. Scalpel was then used to make a low transverse incision on the uterus which was extended laterally with  blunt dissection. The fetal vertex was identified, a Mity Vacuum  was placed to aid in delivery of the vertex.  The head was then delivered easily through the uterine incision followed by the body. The A live female infant was bulb suctioned on the operative field cried vigorously, cord was clamped and cut and the infant was passed to the waiting neonatologist. Apgars 9/9. Placenta was then delivered spontaneously, intact and appear normal, the uterus was cleared of all clot and debris. The uterine incision was repaired with #0 Monocryl in running locked fashion. A second imbricating suture was performed using the same suture. The incision was hemostatic. Ovaries and tubes were inspected and normal. The Alexis retractor was removed. The abdominal cavity was cleared of all clot and debris. The abdominal peritoneum was reapproximated with 2-0 chromic  in a running fashion, the rectus muscles was reapproximated with #2 chromic in interrupted fashion. The fascia was closed with 1 PDS in a running fashion from both ends meeting in the middle.  The subcuticular layer was irrigated and all bleeders cauterized.  The subcutaneous layer was reapproximated with 2-0 plain gut in interrupted fashion.  The skin was closed with 4-0 vicryl in a subcuticular fashion using a Mellody Dance needle. The incision was covered with Dermabond and a Honeycomb and pressure dressing was placed. All sponge lap and needle counts were correct x3. Patient tolerated the procedure well and recovered in stable condition following the procedure.  Instrument, sponge, and needle counts were correct prior the abdominal closure and at the conclusion of the case.   Findings: Live female infant, Apgars 9/9, clear amniotic fluid, normal appearing placenta, normal uterus, bilateral tubes and ovaries  Estimated Blood Loss:         Drains: Foley catheter  Specimens: Placenta to L&D         Implants: none         Complications:  None; patient tolerated the procedure well.         Disposition: PACU -  hemodynamically stable.   Dominique Anderson STACIA

## 2015-02-03 ENCOUNTER — Encounter (HOSPITAL_COMMUNITY): Payer: Self-pay | Admitting: Obstetrics & Gynecology

## 2015-02-03 ENCOUNTER — Encounter (HOSPITAL_COMMUNITY): Payer: Self-pay | Admitting: Anesthesiology

## 2015-02-03 LAB — CBC
HCT: 34.4 % — ABNORMAL LOW (ref 36.0–46.0)
Hemoglobin: 11.4 g/dL — ABNORMAL LOW (ref 12.0–15.0)
MCH: 27.1 pg (ref 26.0–34.0)
MCHC: 33.1 g/dL (ref 30.0–36.0)
MCV: 81.9 fL (ref 78.0–100.0)
PLATELETS: 180 10*3/uL (ref 150–400)
RBC: 4.2 MIL/uL (ref 3.87–5.11)
RDW: 14.5 % (ref 11.5–15.5)
WBC: 9.7 10*3/uL (ref 4.0–10.5)

## 2015-02-03 LAB — TYPE AND SCREEN
ABO/RH(D): O NEG
Antibody Screen: POSITIVE
DAT, IgG: NEGATIVE
UNIT DIVISION: 0
Unit division: 0

## 2015-02-03 LAB — BIRTH TISSUE RECOVERY COLLECTION (PLACENTA DONATION)

## 2015-02-03 MED ORDER — RHO D IMMUNE GLOBULIN 1500 UNIT/2ML IJ SOSY
300.0000 ug | PREFILLED_SYRINGE | Freq: Once | INTRAMUSCULAR | Status: AC
  Administered 2015-02-03: 300 ug via INTRAVENOUS
  Filled 2015-02-03: qty 2

## 2015-02-03 NOTE — Progress Notes (Signed)
Patient is eating, ambulating, voiding.  Pain control is good.  + flatus.  Appropriate lochia.  No complaints.  Filed Vitals:   02/02/15 1800 02/02/15 2100 02/03/15 0058 02/03/15 0545  BP:  118/67 114/79 108/70  Pulse:  66 102 104  Temp:  97.9 F (36.6 C) 97.8 F (36.6 C) 97.9 F (36.6 C)  TempSrc:  Oral Oral Oral  Resp:  20 20 20   SpO2: 95% 94% 98% 99%    Fundus firm Inc: c/d/i Ext: no CT  Lab Results  Component Value Date   WBC 9.7 02/03/2015   HGB 11.4* 02/03/2015   HCT 34.4* 02/03/2015   MCV 81.9 02/03/2015   PLT 180 02/03/2015    --/--/O NEG (03/08 0550)  A/P Post op day #1 s/p R-C/S Doing very well  Routine care.  Expect d/c 3/9.    Philip AspenALLAHAN, Lizbett Garciagarcia

## 2015-02-04 LAB — RH IG WORKUP (INCLUDES ABO/RH)
ABO/RH(D): O NEG
Antibody Screen: POSITIVE
DAT, IgG: NEGATIVE
Fetal Screen: NEGATIVE
Gestational Age(Wks): 38.4
Unit division: 0

## 2015-02-04 MED ORDER — IBUPROFEN 600 MG PO TABS: 600.0000 mg | ORAL_TABLET | Freq: Four times a day (QID) | ORAL | Status: DC

## 2015-02-04 MED ORDER — OXYCODONE-ACETAMINOPHEN 5-325 MG PO TABS
1.0000 | ORAL_TABLET | Freq: Four times a day (QID) | ORAL | Status: DC | PRN
Start: 2015-02-04 — End: 2017-02-27

## 2015-02-04 NOTE — Progress Notes (Signed)
Patient is eating, ambulating, voiding.  Pain control is good.  + flatus.  Appropriate lochia.  No complaints.    Filed Vitals:   02/03/15 0058 02/03/15 0545 02/03/15 1822 02/04/15 0651  BP: 114/79 108/70 128/60 157/83  Pulse: 102 104 102 90  Temp: 97.8 F (36.6 C) 97.9 F (36.6 C) 98.3 F (36.8 C) 97.7 F (36.5 C)  TempSrc: Oral Oral Axillary Oral  Resp: 20 20  18   SpO2: 98% 99% 100%     Fundus firm Inc: c/d/i Ext: 1+ edema b/l, symmetric.     Lab Results  Component Value Date   WBC 9.7 02/03/2015   HGB 11.4* 02/03/2015   HCT 34.4* 02/03/2015   MCV 81.9 02/03/2015   PLT 180 02/03/2015    --/--/O NEG (03/08 0550)/rubella immune  A/P G2P2 POD#2 s/p RCS Doing well Rh neg: baby O+, s/p rhogam on 3/8  Routine care. Desires d/c today.  Reviewed incision care prior to d/c.   First bp today 157/83, all other BPs 100-120/70-80s; will recheck prior to d/c.     [facility]ists AssociationDYANNA Anderson [name]

## 2015-02-04 NOTE — Discharge Summary (Signed)
Obstetric Discharge Summary Reason for Admission: cesarean section Prenatal Procedures: none Intrapartum Procedures: cesarean: low cervical, transverse Postpartum Procedures: Rho(D) Ig Complications-Operative and Postpartum: none HEMOGLOBIN  Date Value Ref Range Status  02/03/2015 11.4* 12.0 - 15.0 g/dL Final   HCT  Date Value Ref Range Status  02/03/2015 34.4* 36.0 - 46.0 % Final    Physical Exam:  General: alert, cooperative and morbidly obese Lochia: appropriate Uterine Fundus: firm Incision: healing well, no significant drainage DVT Evaluation: No evidence of DVT seen on physical exam.  Discharge Diagnoses: Term Pregnancy-delivered  Discharge Information: Date: 02/04/2015 Activity: pelvic rest Diet: routine Medications: PNV, Ibuprofen, Colace and Percocet Condition: stable Instructions: refer to practice specific booklet Discharge to: home Follow-up Information    Follow up with PINN, Dominique MaeWALDA STACIA, MD In 10 days.   Specialty:  Obstetrics and Gynecology   Why:  For wound re-check   Contact information:   [address] [place] [address] [phone]       Newborn Data: Live born female  Birth Weight: 8 lb 1.3 oz (3665 g) APGAR: 9, 9  Home with mother.  Dominique Anderson Dominique Anderson 02/04/2015, 8:58 AM

## 2015-02-04 NOTE — Discharge Instructions (Signed)
You may wash incision with soap and water.   °Do not soak the incision for 2 weeks (no tub baths or swimming).   °Keep incision dry. You may need to keep a sanitary pad or panty liner between the incision and your clothing for comfort and to keep the incision dry.  If you note drainage, increased pain, or increased redness of the incision, then please notify your physician. ° °Pelvic rest x 6 weeks (no intercourse or tampons)  ° °No lifting over 10 lbs for 6 weeks.  ° °Do not drive until you are not taking narcotic pain medication AND you can comfortably slam on the brakes. ° °You have a mild anemia due to blood loss from delivery.  Please continue to take a prenatal vitamin daily °

## 2016-02-09 ENCOUNTER — Emergency Department (HOSPITAL_BASED_OUTPATIENT_CLINIC_OR_DEPARTMENT_OTHER)
Admission: EM | Admit: 2016-02-09 | Discharge: 2016-02-09 | Disposition: A | Discharge status: Home / Self Care | Payer: Medicaid Other | Attending: Emergency Medicine | Admitting: Emergency Medicine

## 2016-02-09 ENCOUNTER — Emergency Department (HOSPITAL_BASED_OUTPATIENT_CLINIC_OR_DEPARTMENT_OTHER): Payer: Medicaid Other

## 2016-02-09 ENCOUNTER — Encounter (HOSPITAL_BASED_OUTPATIENT_CLINIC_OR_DEPARTMENT_OTHER): Payer: Self-pay | Admitting: *Deleted

## 2016-02-09 DIAGNOSIS — R110 Nausea: Secondary | ICD-10-CM | POA: Diagnosis not present

## 2016-02-09 DIAGNOSIS — Z79899 Other long term (current) drug therapy: Secondary | ICD-10-CM | POA: Diagnosis not present

## 2016-02-09 DIAGNOSIS — Z880 Allergy status to penicillin: Secondary | ICD-10-CM | POA: Insufficient documentation

## 2016-02-09 DIAGNOSIS — J111 Influenza due to unidentified influenza virus with other respiratory manifestations: Secondary | ICD-10-CM

## 2016-02-09 DIAGNOSIS — R05 Cough: Secondary | ICD-10-CM | POA: Diagnosis present

## 2016-02-09 DIAGNOSIS — Z87891 Personal history of nicotine dependence: Secondary | ICD-10-CM | POA: Diagnosis not present

## 2016-02-09 DIAGNOSIS — Z8632 Personal history of gestational diabetes: Secondary | ICD-10-CM | POA: Diagnosis not present

## 2016-02-09 MED ORDER — NAPROXEN 500 MG PO TABS: 500.0000 mg | ORAL_TABLET | Freq: Two times a day (BID) | ORAL | Status: DC

## 2016-02-09 MED ORDER — GUAIFENESIN-CODEINE 100-10 MG/5ML PO SYRP: 5.0000 mL | ORAL_SOLUTION | Freq: Three times a day (TID) | ORAL | Status: DC | PRN

## 2016-02-09 MED ORDER — ONDANSETRON 4 MG PO TBDP
4.0000 mg | ORAL_TABLET | Freq: Once | ORAL | Status: AC
  Administered 2016-02-09: 4 mg via ORAL
  Filled 2016-02-09: qty 1

## 2016-02-09 MED ORDER — ONDANSETRON 4 MG PO TBDP: 4.0000 mg | ORAL_TABLET | Freq: Three times a day (TID) | ORAL | Status: DC | PRN

## 2016-02-09 MED FILL — NAPROXEN 500 MG TABLET: 500 | 15 days supply | Qty: 30 | Fill #0

## 2016-02-09 MED FILL — ONDANSETRON ODT 4 MG TABLET: 4 | 2 days supply | Qty: 6 | Fill #0

## 2016-02-09 MED FILL — CHERATUSSIN AC SYRUP: 100-10 | 8 days supply | Qty: 120 | Fill #0

## 2016-02-09 NOTE — ED Notes (Signed)
Fever, cough, bodyaches since yesterday.

## 2016-02-09 NOTE — ED Notes (Signed)
Patient transported to X-ray 

## 2016-02-09 NOTE — ED Provider Notes (Signed)
CSN: [id number]     Arrival date & time 02/09/16  1414 History   First MD Initiated Contact with Patient 02/09/16 1458     Chief Complaint  Patient presents with  . Cough     HPI  Patient presents evaluation of the general ill spirits symptoms since yesterday fever cough body aches headaches nausea no vomiting or diarrhea no dysuria no flank pain. Not immunized for flu.  Past Medical History  Diagnosis Date  . Gestational diabetes   . Morbid obesity (HCC)   . Pregnancy induced hypertension 2011    resolved with delivery  . PONV (postoperative nausea and vomiting)    Past Surgical History  Procedure Laterality Date  . Cesarean section  2011  . Cesarean section N/A 02/02/2015    Procedure: CESAREAN SECTION;  Surgeon: Essie Hart, MD;  Location: [facility];  Service: Obstetrics;  Laterality: N/A;   Family History  Problem Relation Age of Onset  . Hypertension Mother   . Hypertension Sister   . Hypertension Maternal Uncle   . Diabetes Maternal Uncle   . Diabetes Maternal Grandmother   . Hypertension Maternal Grandmother   . Alcohol abuse Neg Hx   . Arthritis Neg Hx   . Asthma Neg Hx   . Birth defects Neg Hx   . Cancer Neg Hx   . COPD Neg Hx   . Depression Neg Hx   . Drug abuse Neg Hx   . Early death Neg Hx   . Hearing loss Neg Hx   . Heart disease Neg Hx   . Hyperlipidemia Neg Hx   . Kidney disease Neg Hx   . Learning disabilities Neg Hx   . Mental illness Neg Hx   . Mental retardation Neg Hx   . Miscarriages / Stillbirths Neg Hx   . Stroke Neg Hx   . Vision loss Neg Hx   . Varicose Veins Neg Hx    Social History  Substance Use Topics  . Smoking status: Former Smoker -- 0.50 packs/day for 10 years    Types: Cigarettes    Quit date: 02/06/2014  . Smokeless tobacco: Never Used  . Alcohol Use: No   OB History    Gravida Para Term Preterm AB TAB SAB Ectopic Multiple Living   0 1     Review of Systems  Constitutional: Positive for fever and chills.  Negative for diaphoresis, appetite change and fatigue.  HENT: Negative for mouth sores, sore throat and trouble swallowing.   Eyes: Negative for visual disturbance.  Respiratory: Positive for cough. Negative for chest tightness, shortness of breath and wheezing.   Cardiovascular: Negative for chest pain.  Gastrointestinal: Negative for nausea, vomiting, abdominal pain, diarrhea and abdominal distention.  Endocrine: Negative for polydipsia, polyphagia and polyuria.  Genitourinary: Negative for dysuria, frequency and hematuria.  Musculoskeletal: Positive for myalgias. Negative for gait problem.  Skin: Negative for color change, pallor and rash.  Neurological: Positive for headaches. Negative for dizziness, syncope and light-headedness.  Hematological: Does not bruise/bleed easily.  Psychiatric/Behavioral: Negative for behavioral problems and confusion.      Allergies  Penicillins  Home Medications   Prior to Admission medications   Medication Sig Start Date End Date Taking? Authorizing Provider  guaiFENesin-codeine (CHERATUSSIN AC) 100-10 MG/5ML syrup Take 5 mLs by mouth 3 (three) times daily as needed for cough. 02/09/16   Rolland Porter, MD  ibuprofen (ADVIL,MOTRIN) 600 MG tablet Take 1 tablet (600 mg total)  by mouth every 6 (six) hours. 02/04/15   Marlow Baarsyanna Clark, MD  naproxen (NAPROSYN) 500 MG tablet Take 1 tablet (500 mg total) by mouth 2 (two) times daily. 02/09/16   Rolland PorterMark Kalima Saylor, MD  ondansetron (ZOFRAN ODT) 4 MG disintegrating tablet Take 1 tablet (4 mg total) by mouth every 8 (eight) hours as needed for nausea. 02/09/16   Rolland PorterMark Airam Runions, MD  oxyCODONE-acetaminophen (PERCOCET/ROXICET) 5-325 MG per tablet Take 1-2 tablets by mouth every 6 (six) hours as needed (for pain scale equal to or greater than 7). 02/04/15   Marlow Baarsyanna Clark, MD  prenatal vitamin w/FE, FA (NATACHEW) 29-1 MG CHEW chewable tablet Chew 1 tablet by mouth daily at 12 noon. 06/19/14   Purvis SheffieldForrest Harrison, MD   BP 149/93 mmHg  Pulse 103   Temp(Src) 97.8 F (36.6 C) (Oral)  Resp 20  Ht 5\' 3"  (1.6 m)  SpO2 99%  LMP 01/16/2016 Physical Exam  Constitutional: She is oriented to person, place, and time. She appears well-developed and well-nourished. No distress.  HENT:  Head: Normocephalic.  Eyes: Conjunctivae are normal. Pupils are equal, round, and reactive to light. No scleral icterus.  Neck: Normal range of motion. Neck supple. No thyromegaly present.  Cardiovascular: Normal rate and regular rhythm.  Exam reveals no gallop and no friction rub.   No murmur heard. Pulmonary/Chest: Effort normal and breath sounds normal. No respiratory distress. She has no wheezes. She has no rales.  Clear bilateral breath sounds. No wheezing rales rhonchi. No prolongation. No focal  changes.  Abdominal: Soft. Bowel sounds are normal. She exhibits no distension. There is no tenderness. There is no rebound.  Musculoskeletal: Normal range of motion.  Neurological: She is alert and oriented to person, place, and time.  Skin: Skin is warm and dry. No rash noted.  Psychiatric: She has a normal mood and affect. Her behavior is normal.    ED Course  Procedures (including critical care time) Labs Review Labs Reviewed - No data to display  Imaging Review Dg Chest 2 View  02/09/2016  CLINICAL DATA:  Cough, congestion and fever beginning yesterday. Initial encounter. EXAM: CHEST  2 VIEW COMPARISON:  None. FINDINGS: The lungs are clear. Heart size is normal. There is no pneumothorax or pleural effusion. No focal bony abnormality. IMPRESSION: Negative chest. Electronically Signed   By: Drusilla Kannerhomas  Dalessio M.D.   On: 02/09/2016 15:21   I have personally reviewed and evaluated these images and lab results as part of my medical decision-making.   EKG Interpretation None      MDM   Final diagnoses:  Influenza    Normal x-ray. Plan will be symptomatic treatment. Home rest and fluids anti-inflammatories cough medicine Zofran as needed.    Rolland PorterMark  Leigha Olberding, MD 02/09/16 [phone]

## 2016-02-09 NOTE — Discharge Instructions (Signed)

## 2016-02-11 ENCOUNTER — Encounter (HOSPITAL_COMMUNITY): Payer: Self-pay

## 2016-02-11 ENCOUNTER — Emergency Department (HOSPITAL_COMMUNITY)
Admission: EM | Admit: 2016-02-11 | Discharge: 2016-02-11 | Disposition: A | Discharge status: Home / Self Care | Payer: Medicaid Other | Attending: Emergency Medicine | Admitting: Emergency Medicine

## 2016-02-11 DIAGNOSIS — Z87891 Personal history of nicotine dependence: Secondary | ICD-10-CM | POA: Diagnosis not present

## 2016-02-11 DIAGNOSIS — Z791 Long term (current) use of non-steroidal anti-inflammatories (NSAID): Secondary | ICD-10-CM | POA: Insufficient documentation

## 2016-02-11 DIAGNOSIS — H6122 Impacted cerumen, left ear: Secondary | ICD-10-CM | POA: Diagnosis not present

## 2016-02-11 DIAGNOSIS — Z8632 Personal history of gestational diabetes: Secondary | ICD-10-CM | POA: Diagnosis not present

## 2016-02-11 DIAGNOSIS — Z880 Allergy status to penicillin: Secondary | ICD-10-CM | POA: Insufficient documentation

## 2016-02-11 DIAGNOSIS — J069 Acute upper respiratory infection, unspecified: Secondary | ICD-10-CM | POA: Insufficient documentation

## 2016-02-11 DIAGNOSIS — R112 Nausea with vomiting, unspecified: Secondary | ICD-10-CM | POA: Diagnosis not present

## 2016-02-11 DIAGNOSIS — M791 Myalgia: Secondary | ICD-10-CM | POA: Diagnosis not present

## 2016-02-11 DIAGNOSIS — J988 Other specified respiratory disorders: Secondary | ICD-10-CM

## 2016-02-11 DIAGNOSIS — B9789 Other viral agents as the cause of diseases classified elsewhere: Secondary | ICD-10-CM

## 2016-02-11 DIAGNOSIS — Z79899 Other long term (current) drug therapy: Secondary | ICD-10-CM | POA: Diagnosis not present

## 2016-02-11 DIAGNOSIS — R05 Cough: Secondary | ICD-10-CM | POA: Diagnosis present

## 2016-02-11 MED ORDER — PROMETHAZINE-DM 6.25-15 MG/5ML PO SYRP: 5.0000 mL | ORAL_SOLUTION | Freq: Four times a day (QID) | ORAL | Status: DC | PRN

## 2016-02-11 NOTE — ED Provider Notes (Signed)
CSN: [id number]     Arrival date & time 02/11/16  1400 History  By signing my name below, I, Dominique Anderson, attest that this documentation has been prepared under the direction and in the presence of non-physician practitioner, Fayrene HelperBowie Leary Mcnulty, PA-C. Electronically Signed: Freida Busmaniana Anderson, Scribe. 02/11/2016. 3:07 PM.    Chief Complaint  Patient presents with  . Generalized Body Aches  . Nasal Congestion  . Cough    The history is provided by the patient. No language interpreter was used.     HPI Comments:  Dominique Anderson is a 30 y.o. female who presents to the Emergency Department complaining of persistent cough x ~ 3-4 days. She reports associated subjective fever, chills, sore throat, nausea, vomiting, generalized body aches, congestion and mild SOB and CP secondary to cough. [facility] for the same on 02/09/16, diagnosed with the flu, and discharged with naproxen, zofran, and guaifenesin which she has taken with some improvement of her symptoms. She denies h/o asthma/COPD. She has not smoked in ~ 8 months.  Past Medical History  Diagnosis Date  . Gestational diabetes   . Morbid obesity (HCC)   . Pregnancy induced hypertension 2011    resolved with delivery  . PONV (postoperative nausea and vomiting)    Past Surgical History  Procedure Laterality Date  . Cesarean section  2011  . Cesarean section N/A 02/02/2015    Procedure: CESAREAN SECTION;  Surgeon: Essie HartWalda Pinn, MD;  Location: [facility];  Service: Obstetrics;  Laterality: N/A;   Family History  Problem Relation Age of Onset  . Hypertension Mother   . Hypertension Sister   . Hypertension Maternal Uncle   . Diabetes Maternal Uncle   . Diabetes Maternal Grandmother   . Hypertension Maternal Grandmother   . Alcohol abuse Neg Hx   . Arthritis Neg Hx   . Asthma Neg Hx   . Birth defects Neg Hx   . Cancer Neg Hx   . COPD Neg Hx   . Depression Neg Hx   . Drug abuse Neg Hx   . Early death Neg Hx   . Hearing loss Neg Hx   .  Heart disease Neg Hx   . Hyperlipidemia Neg Hx   . Kidney disease Neg Hx   . Learning disabilities Neg Hx   . Mental illness Neg Hx   . Mental retardation Neg Hx   . Miscarriages / Stillbirths Neg Hx   . Stroke Neg Hx   . Vision loss Neg Hx   . Varicose Veins Neg Hx    Social History  Substance Use Topics  . Smoking status: Former Smoker -- 0.50 packs/day for 10 years    Types: Cigarettes    Quit date: 02/06/2014  . Smokeless tobacco: Never Used  . Alcohol Use: No   OB History    Gravida Para Term Preterm AB TAB SAB Ectopic Multiple Living   2 2 2       0 1     Review of Systems  Constitutional: Positive for fever (subjective) and chills.  HENT: Positive for congestion and sore throat.   Respiratory: Positive for cough and shortness of breath.   Cardiovascular: Positive for chest pain.  Gastrointestinal: Positive for nausea and vomiting.  Musculoskeletal: Positive for myalgias.    Allergies  Penicillins  Home Medications   Prior to Admission medications   Medication Sig Start Date End Date Taking? Authorizing Provider  guaiFENesin-codeine (CHERATUSSIN AC) 100-10 MG/5ML syrup Take 5 mLs  by mouth 3 (three) times daily as needed for cough. 02/09/16   Rolland Porter, MD  ibuprofen (ADVIL,MOTRIN) 600 MG tablet Take 1 tablet (600 mg total) by mouth every 6 (six) hours. 02/04/15   Marlow Baars, MD  naproxen (NAPROSYN) 500 MG tablet Take 1 tablet (500 mg total) by mouth 2 (two) times daily. 02/09/16   Rolland Porter, MD  ondansetron (ZOFRAN ODT) 4 MG disintegrating tablet Take 1 tablet (4 mg total) by mouth every 8 (eight) hours as needed for nausea. 02/09/16   Rolland Porter, MD  oxyCODONE-acetaminophen (PERCOCET/ROXICET) 5-325 MG per tablet Take 1-2 tablets by mouth every 6 (six) hours as needed (for pain scale equal to or greater than 7). 02/04/15   Marlow Baars, MD  prenatal vitamin w/FE, FA (NATACHEW) 29-1 MG CHEW chewable tablet Chew 1 tablet by mouth daily at 12 noon. 06/19/14   Purvis Sheffield, MD   BP 144/85 mmHg  Pulse 100  Temp(Src) 98.5 F (36.9 C) (Oral)  Resp 20  SpO2 100%  LMP 01/16/2016 Physical Exam  Constitutional: She is oriented to person, place, and time. She appears well-developed and well-nourished. No distress.  HENT:  Head: Normocephalic and atraumatic.  Right Ear: Tympanic membrane normal.  Nose: Rhinorrhea present.  Mouth/Throat: Uvula is midline. No trismus in the jaw.  Cerumen impaction left ear unable to visual TM  Bilateral tonisllar enlargenment  Postnasal drip noted  Eyes: Conjunctivae are normal.  Neck: No tracheal deviation present.  Cardiovascular: Normal rate, regular rhythm and normal heart sounds.   Pulmonary/Chest: Effort normal and breath sounds normal. No respiratory distress.  Abdominal: She exhibits no distension.  Neurological: She is alert and oriented to person, place, and time.  Skin: Skin is warm and dry.  Psychiatric: She has a normal mood and affect.  Nursing note and vitals reviewed.   ED Course  Procedures   DIAGNOSTIC STUDIES:  Oxygen Saturation is 100% on RA, normal by my interpretation.    COORDINATION OF CARE:  3:02 PM Discussed treatment plan with pt at bedside and pt agreed to plan.  Imaging Review Dg Chest 2 View  02/09/2016  CLINICAL DATA:  Cough, congestion and fever beginning yesterday. Initial encounter. EXAM: CHEST  2 VIEW COMPARISON:  None. FINDINGS: The lungs are clear. Heart size is normal. There is no pneumothorax or pleural effusion. No focal bony abnormality. IMPRESSION: Negative chest. Electronically Signed   By: Drusilla Kanner M.D.   On: 02/09/2016 15:21   I have personally reviewed and evaluated these images and lab results as part of my medical decision-making.    MDM  Pt symptoms consistent with a viral syndrome.  Pt will be discharged with symptomatic treatment.  Discussed return precautions.  Pt is hemodynamically stable & in NAD prior to discharge.  Final diagnoses:  Viral  respiratory infection    BP 144/85 mmHg  Pulse 100  Temp(Src) 98.5 F (36.9 C) (Oral)  Resp 20  SpO2 100%  LMP 01/16/2016   I personally performed the services described in this documentation, which was scribed in my presence. The recorded information has been reviewed and is accurate.      Fayrene Helper, PA-C 02/11/16 1512  Gerhard Munch, MD 02/12/16 [phone]

## 2016-02-11 NOTE — Discharge Instructions (Signed)
Viral Infections °A viral infection can be caused by different types of viruses. Most viral infections are not serious and resolve on their own. However, some infections may cause severe symptoms and may lead to further complications. °SYMPTOMS °Viruses can frequently cause: °· Minor sore throat. °· Aches and pains. °· Headaches. °· Runny nose. °· Different types of rashes. °· Watery eyes. °· Tiredness. °· Cough. °· Loss of appetite. °· Gastrointestinal infections, resulting in nausea, vomiting, and diarrhea. °These symptoms do not respond to antibiotics because the infection is not caused by bacteria. However, you might catch a bacterial infection following the viral infection. This is sometimes called a "superinfection." Symptoms of such a bacterial infection may include: °· Worsening sore throat with pus and difficulty swallowing. °· Swollen neck glands. °· Chills and a high or persistent fever. °· Severe headache. °· Tenderness over the sinuses. °· Persistent overall ill feeling (malaise), muscle aches, and tiredness (fatigue). °· Persistent cough. °· Yellow, green, or Luppino mucus production with coughing. °HOME CARE INSTRUCTIONS  °· Only take over-the-counter or prescription medicines for pain, discomfort, diarrhea, or fever as directed by your caregiver. °· Drink enough water and fluids to keep your urine clear or pale yellow. Sports drinks can provide valuable electrolytes, sugars, and hydration. °· Get plenty of rest and maintain proper nutrition. Soups and broths with crackers or rice are fine. °SEEK IMMEDIATE MEDICAL CARE IF:  °· You have severe headaches, shortness of breath, chest pain, neck pain, or an unusual rash. °· You have uncontrolled vomiting, diarrhea, or you are unable to keep down fluids. °· You or your child has an oral temperature above 102° F (38.9° C), not controlled by medicine. °· Your baby is older than 3 months with a rectal temperature of 102° F (38.9° C) or higher. °· Your baby is 3  months old or younger with a rectal temperature of 100.4° F (38° C) or higher. °MAKE SURE YOU:  °· Understand these instructions. °· Will watch your condition. °· Will get help right away if you are not doing well or get worse. °  °This information is not intended to replace advice given to you by your health care provider. Make sure you discuss any questions you have with your health care provider. °  °Document Released: 08/24/2005 Document Revised: 02/06/2012 Document Reviewed: 04/22/2015 °Elsevier Interactive Patient Education ©2016 [employer] ° °

## 2016-02-11 NOTE — ED Notes (Signed)
Pt c/o generalized body aches, nasal/chest congestion, and cough x 3 days.  Pain score 9/10.  Sts chest hurts w/ coughing.  [facility] for same.  Pt reports taking medications w/o relief.

## 2016-12-22 ENCOUNTER — Encounter (HOSPITAL_BASED_OUTPATIENT_CLINIC_OR_DEPARTMENT_OTHER): Payer: Self-pay | Admitting: *Deleted

## 2016-12-22 ENCOUNTER — Emergency Department (HOSPITAL_BASED_OUTPATIENT_CLINIC_OR_DEPARTMENT_OTHER)
Admission: EM | Admit: 2016-12-22 | Discharge: 2016-12-22 | Disposition: A | Discharge status: Home / Self Care | Payer: Medicaid Other | Attending: Emergency Medicine | Admitting: Emergency Medicine

## 2016-12-22 ENCOUNTER — Emergency Department (HOSPITAL_BASED_OUTPATIENT_CLINIC_OR_DEPARTMENT_OTHER): Payer: Medicaid Other

## 2016-12-22 DIAGNOSIS — Y999 Unspecified external cause status: Secondary | ICD-10-CM | POA: Insufficient documentation

## 2016-12-22 DIAGNOSIS — Y939 Activity, unspecified: Secondary | ICD-10-CM | POA: Diagnosis not present

## 2016-12-22 DIAGNOSIS — M7918 Myalgia, other site: Secondary | ICD-10-CM

## 2016-12-22 DIAGNOSIS — Y92410 Unspecified street and highway as the place of occurrence of the external cause: Secondary | ICD-10-CM | POA: Diagnosis not present

## 2016-12-22 DIAGNOSIS — Z87891 Personal history of nicotine dependence: Secondary | ICD-10-CM | POA: Diagnosis not present

## 2016-12-22 DIAGNOSIS — S199XXA Unspecified injury of neck, initial encounter: Secondary | ICD-10-CM | POA: Diagnosis present

## 2016-12-22 DIAGNOSIS — S161XXA Strain of muscle, fascia and tendon at neck level, initial encounter: Secondary | ICD-10-CM | POA: Diagnosis not present

## 2016-12-22 MED ORDER — METHOCARBAMOL 500 MG PO TABS: 500.0000 mg | ORAL_TABLET | Freq: Two times a day (BID) | ORAL | 0 refills | Status: DC

## 2016-12-22 MED ORDER — IBUPROFEN 800 MG PO TABS: 800.0000 mg | ORAL_TABLET | Freq: Once | ORAL | Status: DC

## 2016-12-22 MED ORDER — IBUPROFEN 800 MG PO TABS: 800.0000 mg | ORAL_TABLET | Freq: Three times a day (TID) | ORAL | 0 refills | Status: DC

## 2016-12-22 MED ORDER — METHOCARBAMOL 500 MG PO TABS: 500.0000 mg | ORAL_TABLET | Freq: Once | ORAL | Status: DC

## 2016-12-22 NOTE — Discharge Instructions (Signed)
See your Physician for recheck in 1 week.  Ice to ara of pain
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
## 2017-02-27 ENCOUNTER — Encounter (HOSPITAL_BASED_OUTPATIENT_CLINIC_OR_DEPARTMENT_OTHER): Payer: Self-pay

## 2017-02-27 DIAGNOSIS — N3000 Acute cystitis without hematuria: Secondary | ICD-10-CM | POA: Insufficient documentation

## 2017-02-27 DIAGNOSIS — J069 Acute upper respiratory infection, unspecified: Secondary | ICD-10-CM | POA: Insufficient documentation

## 2017-02-27 DIAGNOSIS — Z87891 Personal history of nicotine dependence: Secondary | ICD-10-CM | POA: Insufficient documentation

## 2017-02-27 LAB — URINALYSIS, MICROSCOPIC (REFLEX)

## 2017-02-27 LAB — URINALYSIS, ROUTINE W REFLEX MICROSCOPIC
Bilirubin Urine: NEGATIVE
GLUCOSE, UA: NEGATIVE mg/dL
Ketones, ur: NEGATIVE mg/dL
Nitrite: NEGATIVE
PH: 5.5 (ref 5.0–8.0)
Protein, ur: NEGATIVE mg/dL
Specific Gravity, Urine: 1.027 (ref 1.005–1.030)

## 2017-02-27 LAB — PREGNANCY, URINE: Preg Test, Ur: NEGATIVE

## 2017-02-27 NOTE — ED Triage Notes (Signed)
c/o cough x 4 days-NAD-steady gait-also urinary freq x today

## 2017-02-28 ENCOUNTER — Emergency Department (HOSPITAL_BASED_OUTPATIENT_CLINIC_OR_DEPARTMENT_OTHER)
Admission: EM | Admit: 2017-02-28 | Discharge: 2017-02-28 | Disposition: A | Discharge status: Home / Self Care | Payer: Self-pay | Attending: Emergency Medicine | Admitting: Emergency Medicine

## 2017-02-28 ENCOUNTER — Emergency Department (HOSPITAL_BASED_OUTPATIENT_CLINIC_OR_DEPARTMENT_OTHER): Payer: Self-pay

## 2017-02-28 DIAGNOSIS — J069 Acute upper respiratory infection, unspecified: Secondary | ICD-10-CM

## 2017-02-28 DIAGNOSIS — N3000 Acute cystitis without hematuria: Secondary | ICD-10-CM

## 2017-02-28 DIAGNOSIS — B9789 Other viral agents as the cause of diseases classified elsewhere: Secondary | ICD-10-CM

## 2017-02-28 MED ORDER — NITROFURANTOIN MONOHYD MACRO 100 MG PO CAPS: 100.0000 mg | ORAL_CAPSULE | Freq: Two times a day (BID) | ORAL | 0 refills | Status: DC

## 2017-02-28 MED ORDER — PREDNISONE 20 MG PO TABS: 40.0000 mg | ORAL_TABLET | Freq: Every day | ORAL | 0 refills | Status: AC

## 2017-02-28 MED ORDER — ALBUTEROL SULFATE HFA 108 (90 BASE) MCG/ACT IN AERS: 1.0000 | INHALATION_SPRAY | Freq: Four times a day (QID) | RESPIRATORY_TRACT | 0 refills | Status: DC | PRN
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
## 2017-02-28 NOTE — Discharge Instructions (Signed)

## 2017-02-28 NOTE — ED Provider Notes (Signed)
Emergency Department Provider Note  By signing my name below, I, Linna Darner, attest that this documentation has been prepared under the direction and in the presence of physician practitioner, Maia Plan, MD. Electronically Signed: Linna Darner, Scribe. 02/28/2017. 12:35 AM.  I have reviewed the triage vital signs and the nursing notes.   HISTORY  Chief Complaint Cough  HPI Comments: Dominique Anderson is a 31 y.o. female who presents to the Emergency Department complaining of a persistent cough productive of sputum beginning 4 days ago. She reports associated generalized body aches and some chest pain secondary to her cough. Patient has tried Mucinex with no improvement of her cough. She also reports some dysuria and urinary frequency beginning yesterday morning.and states these symptoms are consistent with past UTI's. She is a former smoker. She denies fevers, chills, vaginal bleeding/discharge, or any other associated symptoms. No radiation of symptoms. No modifying factors.   Past Medical History:  Diagnosis Date  . Gestational diabetes   . Morbid obesity (HCC)   . PONV (postoperative nausea and vomiting)   . Pregnancy induced hypertension 2011   resolved with delivery    Patient Active Problem List   Diagnosis Date Noted  . Status post repeat low transverse cesarean section 02/02/2015    Past Surgical History:  Procedure Laterality Date  . CESAREAN SECTION  2011  . CESAREAN SECTION N/A 02/02/2015   Procedure: CESAREAN SECTION;  Surgeon: Essie Hart, MD;  Location: [facility];  Service: Obstetrics;  Laterality: N/A;    Current Outpatient Rx  . Order #: [id number] Class: Print  . Order #: [id number] Class: Print  . Order #: [id number] Class: Print  . Order #: [id number] Class: Print    Allergies Penicillins  Family History  Problem Relation Age of Onset  . Hypertension Mother   . Hypertension Sister   . Hypertension Maternal Uncle   . Diabetes Maternal Uncle   .  Diabetes Maternal Grandmother   . Hypertension Maternal Grandmother   . Alcohol abuse Neg Hx   . Arthritis Neg Hx   . Asthma Neg Hx   . Birth defects Neg Hx   . Cancer Neg Hx   . COPD Neg Hx   . Depression Neg Hx   . Drug abuse Neg Hx   . Early death Neg Hx   . Hearing loss Neg Hx   . Heart disease Neg Hx   . Hyperlipidemia Neg Hx   . Kidney disease Neg Hx   . Learning disabilities Neg Hx   . Mental illness Neg Hx   . Mental retardation Neg Hx   . Miscarriages / Stillbirths Neg Hx   . Stroke Neg Hx   . Vision loss Neg Hx   . Varicose Veins Neg Hx     Social History Social History  Substance Use Topics  . Smoking status: Former Smoker    Packs/day: 0.50    Years: 10.00    Types: Cigarettes    Quit date: 02/06/2014  . Smokeless tobacco: Never Used  . Alcohol use No    Review of Systems  Constitutional: No fever/chills Eyes: No visual changes. ENT: No sore throat. Cardiovascular: Positive for chest pain (secondary to cough). Respiratory: Denies shortness of breath. Gastrointestinal: No abdominal pain.  No nausea, no vomiting.  No diarrhea.  No constipation. Genitourinary: Positive for dysuria and urinary frequency. Musculoskeletal: Negative for back pain. Skin: Negative for rash. Neurological: Negative for headaches, focal weakness or numbness.  10-point ROS otherwise negative.  ____________________________________________  PHYSICAL EXAM:  VITAL SIGNS: ED Triage Vitals  Enc Vitals Group     BP 02/27/17 2145 (!) 144/92     Pulse Rate 02/27/17 2145 (!) 105     Resp 02/27/17 2145 (!) 22     Temp 02/27/17 2145 98.3 F (36.8 C)     Temp Source 02/27/17 2145 Oral     SpO2 02/27/17 2145 99 %     Weight 02/27/17 2145 (!) 315 lb (142.9 kg)     Height 02/27/17 2145 5' (1.524 m)     Pain Score 02/27/17 2144 9   Constitutional: Alert and oriented. Well appearing and in no acute distress. Eyes: Conjunctivae are normal.  Head: Atraumatic. Nose: No  congestion/rhinnorhea. Mouth/Throat: Mucous membranes are moist.  Neck: No stridor. Cardiovascular: Tachycardia. Good peripheral circulation. Grossly normal heart sounds.   Respiratory: Normal respiratory effort.  No retractions. Lungs with faint bilateral wheezing.  Gastrointestinal: Soft and nontender. No distention. No CVA tenderness.  Musculoskeletal: No lower extremity tenderness nor edema. No gross deformities of extremities. Neurologic:  Normal speech and language. No gross focal neurologic deficits are appreciated.  Skin:  Skin is warm, dry and intact. No rash noted. Psychiatric: Mood and affect are normal. Speech and behavior are normal.  ____________________________________________   LABS (all labs ordered are listed, but only abnormal results are displayed)  Labs Reviewed  URINALYSIS, ROUTINE W REFLEX MICROSCOPIC - Abnormal; Notable for the following:       Result Value   APPearance TURBID (*)    Hgb urine dipstick SMALL (*)    Leukocytes, UA LARGE (*)    All other components within normal limits  URINALYSIS, MICROSCOPIC (REFLEX) - Abnormal; Notable for the following:    Bacteria, UA MANY (*)    Squamous Epithelial / LPF TOO NUMEROUS TO COUNT (*)    All other components within normal limits  PREGNANCY, URINE   ____________________________________________  RADIOLOGY  Dg Chest 2 View  Result Date: 02/28/2017 CLINICAL DATA:  Cough and dyspnea for 4 days. EXAM: CHEST  2 VIEW COMPARISON:  02/09/2016 FINDINGS: The lungs are clear. The pulmonary vasculature is normal. Heart size is normal. Hilar and mediastinal contours are unremarkable. There is no pleural effusion. IMPRESSION: No active cardiopulmonary disease. Electronically Signed   By: Ellery Plunk M.D.   On: 02/28/2017 02:05    ____________________________________________   PROCEDURES  Procedure(s) performed:   Procedures   None  ____________________________________________   INITIAL IMPRESSION /  ASSESSMENT AND PLAN / ED COURSE  Pertinent labs & imaging results that were available during my care of the patient were reviewed by me and considered in my medical decision making (see chart for details).  Patient presents to the emergency department for evaluation of viral URI/bronchitis symptoms for the past week. Also with some new onset UTI symptoms. Patient's urinalysis is positive for infection. Plan to treat with Macrobid for the next week. No evidence of pyelonephritis. Plan to obtain chest x-ray to rule out underlying pneumonia. Patient may benefit from brief steroid course and albuterol if no PNA.   02:10 AM Patient with normal CXR. On re-evaluation the patient is in no acute distress. Discussed impression and plan in detail. Will discharge.   At this time, I do not feel there is any life-threatening condition present. I have reviewed and discussed all results (EKG, imaging, lab, urine as appropriate), exam findings with patient. I have reviewed nursing notes and appropriate previous records.  I feel the patient is safe to be discharged  home without further emergent workup. Discussed usual and customary return precautions. Patient and family (if present) verbalize understanding and are comfortable with this plan.  Patient will follow-up with their primary care provider. If they do not have a primary care provider, information for follow-up has been provided to them. All questions have been answered.   ____________________________________________  FINAL CLINICAL IMPRESSION(S) / ED DIAGNOSES  Final diagnoses:  Viral URI with cough  Acute cystitis without hematuria     MEDICATIONS GIVEN DURING THIS VISIT:  None  NEW OUTPATIENT MEDICATIONS STARTED DURING THIS VISIT:  Discharge Medication List as of 02/28/2017  2:11 AM    START taking these medications   Details  albuterol (PROVENTIL HFA;VENTOLIN HFA) 108 (90 Base) MCG/ACT inhaler Inhale 1-2 puffs into the lungs every 6 (six)  hours as needed for wheezing or shortness of breath., Starting Tue 02/28/2017, Print    nitrofurantoin, macrocrystal-monohydrate, (MACROBID) 100 MG capsule Take 1 capsule (100 mg total) by mouth 2 (two) times daily., Starting Tue 02/28/2017, Print    predniSONE (DELTASONE) 20 MG tablet Take 2 tablets (40 mg total) by mouth daily., Starting Tue 02/28/2017, Until Sun 03/05/2017, Print          Note:  This document was prepared using Dragon voice recognition software and may include unintentional dictation errors.  Alona Bene, MD Emergency Medicine  I personally performed the services described in this documentation, which was scribed in my presence. The recorded information has been reviewed and is accurate.      Maia Plan, MD 02/28/17 [phone]

## 2017-02-28 NOTE — ED Notes (Signed)
EDP in to see, at BS.  

## 2017-04-18 ENCOUNTER — Emergency Department (HOSPITAL_BASED_OUTPATIENT_CLINIC_OR_DEPARTMENT_OTHER)
Admission: EM | Admit: 2017-04-18 | Discharge: 2017-04-18 | Disposition: A | Discharge status: Home / Self Care | Payer: No Typology Code available for payment source | Attending: Emergency Medicine | Admitting: Emergency Medicine

## 2017-04-18 ENCOUNTER — Emergency Department (HOSPITAL_BASED_OUTPATIENT_CLINIC_OR_DEPARTMENT_OTHER): Payer: No Typology Code available for payment source

## 2017-04-18 ENCOUNTER — Encounter (HOSPITAL_BASED_OUTPATIENT_CLINIC_OR_DEPARTMENT_OTHER): Payer: Self-pay | Admitting: Emergency Medicine

## 2017-04-18 DIAGNOSIS — Z87891 Personal history of nicotine dependence: Secondary | ICD-10-CM | POA: Insufficient documentation

## 2017-04-18 DIAGNOSIS — N12 Tubulo-interstitial nephritis, not specified as acute or chronic: Secondary | ICD-10-CM | POA: Insufficient documentation

## 2017-04-18 LAB — COMPREHENSIVE METABOLIC PANEL
ALT: 29 U/L (ref 14–54)
AST: 30 U/L (ref 15–41)
Albumin: 4.1 g/dL (ref 3.5–5.0)
Alkaline Phosphatase: 69 U/L (ref 38–126)
Anion gap: 10 (ref 5–15)
BUN: 7 mg/dL (ref 6–20)
CO2: 23 mmol/L (ref 22–32)
CREATININE: 0.68 mg/dL (ref 0.44–1.00)
Calcium: 9 mg/dL (ref 8.9–10.3)
Chloride: 103 mmol/L (ref 101–111)
GFR calc Af Amer: >60 mL/min (ref >60)
GFR calc non Af Amer: >60 mL/min (ref >60)
Glucose, Bld: 93 mg/dL (ref 65–99)
Potassium: 3.5 mmol/L (ref 3.5–5.1)
SODIUM: 136 mmol/L (ref 135–145)
Total Bilirubin: 0.2 mg/dL — ABNORMAL LOW (ref 0.3–1.2)
Total Protein: 7.5 g/dL (ref 6.5–8.1)

## 2017-04-18 LAB — URINALYSIS, ROUTINE W REFLEX MICROSCOPIC
GLUCOSE, UA: NEGATIVE mg/dL
Hgb urine dipstick: NEGATIVE
Ketones, ur: NEGATIVE mg/dL
Nitrite: POSITIVE — AB
PH: 6 (ref 5.0–8.0)
PROTEIN: 30 mg/dL — AB
Specific Gravity, Urine: 1.029 (ref 1.005–1.030)

## 2017-04-18 LAB — CBC WITH DIFFERENTIAL/PLATELET
BASOS ABS: 0 10*3/uL (ref 0.0–0.1)
Basophils Relative: 0 %
EOS ABS: 0.1 10*3/uL (ref 0.0–0.7)
Eosinophils Relative: 1 %
HCT: 42.5 % (ref 36.0–46.0)
HEMOGLOBIN: 14.9 g/dL (ref 12.0–15.0)
Lymphocytes Relative: 32 %
Lymphs Abs: 1.7 10*3/uL (ref 0.7–4.0)
MCH: 28.7 pg (ref 26.0–34.0)
MCHC: 35.1 g/dL (ref 30.0–36.0)
MCV: 81.7 fL (ref 78.0–100.0)
Monocytes Absolute: 0.6 10*3/uL (ref 0.1–1.0)
Monocytes Relative: 11 %
NEUTROS PCT: 56 %
Neutro Abs: 3 10*3/uL (ref 1.7–7.7)
Platelets: 198 10*3/uL (ref 150–400)
RBC: 5.2 MIL/uL — ABNORMAL HIGH (ref 3.87–5.11)
RDW: 13.4 % (ref 11.5–15.5)
WBC: 5.3 10*3/uL (ref 4.0–10.5)

## 2017-04-18 LAB — URINALYSIS, MICROSCOPIC (REFLEX)

## 2017-04-18 LAB — PREGNANCY, URINE: Preg Test, Ur: NEGATIVE

## 2017-04-18 LAB — LIPASE, BLOOD: Lipase: 22 U/L (ref 11–51)

## 2017-04-18 LAB — I-STAT CG4 LACTIC ACID, ED: Lactic Acid, Venous: 0.87 mmol/L (ref 0.5–1.9)

## 2017-04-18 MED ORDER — DEXTROSE 5 % IV SOLN
1.0000 g | Freq: Once | INTRAVENOUS | Status: AC
  Administered 2017-04-18: 1 g via INTRAVENOUS
  Filled 2017-04-18: qty 10

## 2017-04-18 MED ORDER — SODIUM CHLORIDE 0.9 % IV BOLUS (SEPSIS)
1000.0000 mL | Freq: Once | INTRAVENOUS | Status: AC
  Administered 2017-04-18: 1000 mL via INTRAVENOUS

## 2017-04-18 MED ORDER — ACETAMINOPHEN 500 MG PO TABS
1000.0000 mg | ORAL_TABLET | Freq: Once | ORAL | Status: AC
  Administered 2017-04-18: 1000 mg via ORAL
  Filled 2017-04-18: qty 2

## 2017-04-18 MED ORDER — CEPHALEXIN 500 MG PO CAPS: 500.0000 mg | ORAL_CAPSULE | Freq: Three times a day (TID) | ORAL | 0 refills | Status: AC

## 2017-04-18 MED ORDER — ONDANSETRON HCL 4 MG/2ML IJ SOLN
4.0000 mg | Freq: Once | INTRAMUSCULAR | Status: AC
  Administered 2017-04-18: 4 mg via INTRAVENOUS
  Filled 2017-04-18: qty 2

## 2017-04-18 NOTE — ED Provider Notes (Signed)
[facility]   CSN: [id number] Arrival date & time: 04/18/17  1834  By signing my name below, I, Dominique Anderson, attest that this documentation has been prepared under the direction and in the presence of Dominique Pander, MD. Electronically Signed: Thelma Anderson, Scribe. 04/18/17. 8:56 PM.  History   Chief Complaint Chief Complaint  Patient presents with  . Cough  . Dysuria   The history is provided by the patient. No language interpreter was used.  Dysuria   Associated symptoms include chills, vomiting, frequency and flank pain.    HPI Comments: Dominique Anderson is a 31 y.o. female who presents to the Emergency Department complaining of constant, gradually worsening flank pain and dysuria that began two days ago. She has associated fever, chills, increased urinary frequency, diarrhea, vomiting, abdominal pain, cough, diaphoresis, and left-sided ear pain. She denies other associated symptoms. She notes both her children have stomach bugs.   Past Medical History:  Diagnosis Date  . Gestational diabetes   . Morbid obesity (HCC)   . PONV (postoperative nausea and vomiting)   . Pregnancy induced hypertension 2011   resolved with delivery    Patient Active Problem List   Diagnosis Date Noted  . Status post repeat low transverse cesarean section 02/02/2015    Past Surgical History:  Procedure Laterality Date  . CESAREAN SECTION  2011  . CESAREAN SECTION N/A 02/02/2015   Procedure: CESAREAN SECTION;  Surgeon: Essie Hart, MD;  Location: [facility];  Service: Obstetrics;  Laterality: N/A;    OB History    Gravida Para Term Preterm AB Living   2 2 2     1    SAB TAB Ectopic Multiple Live Births         0 1       Home Medications    Prior to Admission medications   Medication Sig Start Date End Date Taking? Authorizing Provider  albuterol (PROVENTIL HFA;VENTOLIN HFA) 108 (90 Base) MCG/ACT inhaler Inhale 1-2 puffs into the lungs every 6 (six) hours as  needed for wheezing or shortness of breath. 02/28/17   Long, Arlyss Repress, MD  nitrofurantoin, macrocrystal-monohydrate, (MACROBID) 100 MG capsule Take 1 capsule (100 mg total) by mouth 2 (two) times daily. 02/28/17   Long, Arlyss Repress, MD  promethazine-dextromethorphan (PROMETHAZINE-DM) 6.25-15 MG/5ML syrup Take 5 mLs by mouth 4 (four) times daily as needed for cough. 02/11/16   Fayrene Helper, PA-C    Family History Family History  Problem Relation Age of Onset  . Hypertension Mother   . Hypertension Sister   . Hypertension Maternal Uncle   . Diabetes Maternal Uncle   . Diabetes Maternal Grandmother   . Hypertension Maternal Grandmother   . Alcohol abuse Neg Hx   . Arthritis Neg Hx   . Asthma Neg Hx   . Birth defects Neg Hx   . Cancer Neg Hx   . COPD Neg Hx   . Depression Neg Hx   . Drug abuse Neg Hx   . Early death Neg Hx   . Hearing loss Neg Hx   . Heart disease Neg Hx   . Hyperlipidemia Neg Hx   . Kidney disease Neg Hx   . Learning disabilities Neg Hx   . Mental illness Neg Hx   . Mental retardation Neg Hx   . Miscarriages / Stillbirths Neg Hx   . Stroke Neg Hx   . Vision loss Neg Hx   . Varicose Veins Neg Hx  Social History Social History  Substance Use Topics  . Smoking status: Former Smoker    Packs/day: 0.50    Years: 10.00    Types: Cigarettes    Quit date: 02/06/2014  . Smokeless tobacco: Never Used  . Alcohol use No     Allergies   Penicillins   Review of Systems Review of Systems  Constitutional: Positive for chills, diaphoresis and fever.  HENT: Positive for ear pain (left-sided).   Respiratory: Positive for cough.   Gastrointestinal: Positive for abdominal pain, diarrhea and vomiting.  Genitourinary: Positive for dysuria, flank pain and frequency.  All other systems reviewed and are negative.    Physical Exam Updated Vital Signs BP (!) 130/98 (BP Location: Left Arm)   Pulse (!) 115   Temp 99.4 F (37.4 C) (Oral)   Resp 20   Ht 5' (1.524 m)    Wt 295 lb (133.8 kg)   LMP 03/19/2017   SpO2 98%   BMI 57.61 kg/m   Physical Exam  Constitutional: She is oriented to person, place, and time. She appears well-developed and well-nourished.  HENT:  Head: Normocephalic and atraumatic.  Cardiovascular: Normal rate.   Pulmonary/Chest: Effort normal.  Abdominal: There is no rebound.  Mild bilateral CVA tenderness Mild epigastric tenderness No rebound  Neurological: She is alert and oriented to person, place, and time.  Skin: Skin is warm and dry.  Psychiatric: She has a normal mood and affect.  Nursing note and vitals reviewed.    ED Treatments / Results  DIAGNOSTIC STUDIES: Oxygen Saturation is 98% on RA, normal by my interpretation.    COORDINATION OF CARE: 8:49 PM Discussed treatment plan with pt at bedside and pt agreed to plan. Labs (all labs ordered are listed, but only abnormal results are displayed) Labs Reviewed  URINALYSIS, ROUTINE W REFLEX MICROSCOPIC - Abnormal; Notable for the following:       Result Value   Color, Urine AMBER (*)    APPearance TURBID (*)    Bilirubin Urine SMALL (*)    Protein, ur 30 (*)    Nitrite POSITIVE (*)    Leukocytes, UA MODERATE (*)    All other components within normal limits  URINALYSIS, MICROSCOPIC (REFLEX) - Abnormal; Notable for the following:    Bacteria, UA MANY (*)    Squamous Epithelial / LPF TOO NUMEROUS TO COUNT (*)    All other components within normal limits  PREGNANCY, URINE  CBC WITH DIFFERENTIAL/PLATELET  COMPREHENSIVE METABOLIC PANEL  I-STAT CG4 LACTIC ACID, ED    EKG  EKG Interpretation None       Radiology Dg Chest 2 View  Result Date: 04/18/2017 CLINICAL DATA:  Cough EXAM: CHEST  2 VIEW COMPARISON:  02/28/2017 FINDINGS: The heart size and mediastinal contours are within normal limits. Both lungs are clear. The visualized skeletal structures are unremarkable. IMPRESSION: No active cardiopulmonary disease. Electronically Signed   By: Marlan Palau M.D.    On: 04/18/2017 19:17    Procedures Procedures (including critical care time)  Medications Ordered in ED Medications  sodium chloride 0.9 % bolus 1,000 mL (not administered)  cefTRIAXone (ROCEPHIN) 1 g in dextrose 5 % 50 mL IVPB (not administered)     Initial Impression / Assessment and Plan / ED Course  I have reviewed the triage vital signs and the nursing notes.  Pertinent labs & imaging results that were available during my care of the patient were reviewed by me and considered in my medical decision making (see chart for  details).     Jacolyn Reedylecia A Goonan is a 31 y.o. female here with abdominal pain, flank pain, cerumen impaction, fever, chills. Likely pyelo vs pneumonia. L ear irrigated with saline and no otitis media on exam. Will get labs, UA, CXR. Tachycardic, will hydrate and reassess.   11:14 PM Hr down to 75 after IVF. WBC nl. UA + WBC and bacteria and nitrate. Given rocephin. Felt better. Will dc home with keflex for 10 days.    Final Clinical Impressions(s) / ED Diagnoses   Final diagnoses:  None    New Prescriptions New Prescriptions   No medications on file  I personally performed the services described in this documentation, which was scribed in my presence. The recorded information has been reviewed and is accurate.     Dominique PanderYao, David Hsienta, MD 04/18/17 [phone]

## 2017-04-18 NOTE — Discharge Instructions (Signed)
Take tylenol, motrin for fever.   Take keflex three times daily for 10 days for complicated UTI   See your doctor  Return to ER if you have fever for a week, severe abdominal pain, vomiting, trouble breathing.

## 2017-04-18 NOTE — ED Triage Notes (Signed)
Cough, ear fullness, congestion since yesterday. Also reports dysuria x 2 days.

## 2017-04-21 LAB — URINE CULTURE

## 2017-04-22 ENCOUNTER — Telehealth: Payer: Self-pay

## 2017-04-22 NOTE — Telephone Encounter (Signed)
Post ED Visit - Positive Culture Follow-up  Culture report reviewed by antimicrobial stewardship pharmacist:  []  Enzo BiNathan Batchelder, Pharm.D. []  Celedonio MiyamotoJeremy Frens, Pharm.D., BCPS AQ-ID []  Garvin FilaMike Maccia, Pharm.D., BCPS []  Georgina PillionElizabeth Martin, [address]Pharm.D., BCPS []  [place] OzarkMinh Pham, [place]arm[place]D., BCPS, AAHIVP []  Estella HuskMichelle Turner, Pharm.D., BCPS, AAHIVP [x]  Lysle Pearlachel Rumbarger, PharmD, BCPS []  Casilda Carlsaylor Stone, PharmD, BCPS []  Pollyann SamplesAndy Johnston, PharmD, BCPS  Positive urine culture Treated with Cephalexin, organism sensitive to the same and no further patient follow-up is required at this time.  Jerry CarasCullom, Boysie Bonebrake Burnett 04/22/2017, 9:48 AM

## 2017-06-12 ENCOUNTER — Emergency Department (HOSPITAL_BASED_OUTPATIENT_CLINIC_OR_DEPARTMENT_OTHER): Payer: Self-pay

## 2017-06-12 ENCOUNTER — Encounter (HOSPITAL_BASED_OUTPATIENT_CLINIC_OR_DEPARTMENT_OTHER): Payer: Self-pay | Admitting: *Deleted

## 2017-06-12 ENCOUNTER — Emergency Department (HOSPITAL_BASED_OUTPATIENT_CLINIC_OR_DEPARTMENT_OTHER)
Admission: EM | Admit: 2017-06-12 | Discharge: 2017-06-12 | Disposition: A | Discharge status: Home / Self Care | Payer: Self-pay | Attending: Emergency Medicine | Admitting: Emergency Medicine

## 2017-06-12 DIAGNOSIS — Y939 Activity, unspecified: Secondary | ICD-10-CM | POA: Insufficient documentation

## 2017-06-12 DIAGNOSIS — W19XXXA Unspecified fall, initial encounter: Secondary | ICD-10-CM

## 2017-06-12 DIAGNOSIS — Z87891 Personal history of nicotine dependence: Secondary | ICD-10-CM | POA: Insufficient documentation

## 2017-06-12 DIAGNOSIS — W109XXA Fall (on) (from) unspecified stairs and steps, initial encounter: Secondary | ICD-10-CM | POA: Insufficient documentation

## 2017-06-12 DIAGNOSIS — Z79899 Other long term (current) drug therapy: Secondary | ICD-10-CM | POA: Insufficient documentation

## 2017-06-12 DIAGNOSIS — O24419 Gestational diabetes mellitus in pregnancy, unspecified control: Secondary | ICD-10-CM | POA: Insufficient documentation

## 2017-06-12 DIAGNOSIS — S92354A Nondisplaced fracture of fifth metatarsal bone, right foot, initial encounter for closed fracture: Secondary | ICD-10-CM | POA: Insufficient documentation

## 2017-06-12 DIAGNOSIS — Y999 Unspecified external cause status: Secondary | ICD-10-CM | POA: Insufficient documentation

## 2017-06-12 DIAGNOSIS — Y929 Unspecified place or not applicable: Secondary | ICD-10-CM | POA: Insufficient documentation

## 2017-06-12 MED ORDER — HYDROCODONE-ACETAMINOPHEN 5-325 MG PO TABS: 1.0000 | ORAL_TABLET | Freq: Four times a day (QID) | ORAL | 0 refills | Status: DC | PRN

## 2017-06-12 MED ORDER — IBUPROFEN 600 MG PO TABS: 600.0000 mg | ORAL_TABLET | Freq: Four times a day (QID) | ORAL | 0 refills | Status: DC | PRN

## 2017-06-12 MED ORDER — KETOROLAC TROMETHAMINE 60 MG/2ML IM SOLN
60.0000 mg | Freq: Once | INTRAMUSCULAR | Status: AC
Start: 2017-06-12 — End: 2017-06-12
  Administered 2017-06-12: 60 mg via INTRAMUSCULAR
  Filled 2017-06-12: qty 2

## 2017-06-12 MED ORDER — OXYCODONE-ACETAMINOPHEN 5-325 MG PO TABS
1.0000 | ORAL_TABLET | Freq: Once | ORAL | Status: AC
  Administered 2017-06-12: 1 via ORAL
  Filled 2017-06-12: qty 1

## 2017-06-12 MED ORDER — OXYCODONE-ACETAMINOPHEN 5-325 MG PO TABS
1.0000 | ORAL_TABLET | ORAL | Status: DC | PRN
  Administered 2017-06-12: 1 via ORAL
  Filled 2017-06-12: qty 1

## 2017-06-12 MED FILL — HYDROCODON-APAP 5-325: 5-325 | 2 days supply | Qty: 20 | Fill #0

## 2017-06-12 MED FILL — IBUPROFEN 600 MG TABLET: 600 | 7 days supply | Qty: 30 | Fill #0

## 2017-06-12 NOTE — ED Provider Notes (Signed)
[facility] Provider Note   CSN: [id number] Arrival date & time: 06/12/17  1317     History   Chief Complaint Chief Complaint  Patient presents with  . Fall    HPI Dominique Reedylecia A Staver is a 31 y.o. female.  HPI   Dominique Anderson is a 31 y.o. female, with a history of morbid obesity, presenting to the ED with injuries from a fall that occurred last night. Patient sustained a mechanical fall while walking down stairs. She somehow inverted both ankles. She complains of 10 out of 10, throbbing pain to the lateral right foot and ankle, nonradiating. Left ankle is also throbbing on the lateral side, but rated 4 out of 10, nonradiating. She has taken ibuprofen without relief. Denies head injury, neck/back pain, nausea/vomiting, neuro deficits, or any other complaints.   Past Medical History:  Diagnosis Date  . Gestational diabetes   . Morbid obesity (HCC)   . PONV (postoperative nausea and vomiting)   . Pregnancy induced hypertension 2011   resolved with delivery    Patient Active Problem List   Diagnosis Date Noted  . Status post repeat low transverse cesarean section 02/02/2015    Past Surgical History:  Procedure Laterality Date  . CESAREAN SECTION  2011  . CESAREAN SECTION N/A 02/02/2015   Procedure: CESAREAN SECTION;  Surgeon: Essie HartWalda Pinn, MD;  Location: [facility];  Service: Obstetrics;  Laterality: N/A;    OB History    Gravida Para Term Preterm AB Living   2 2 2     1    SAB TAB Ectopic Multiple Live Births         0 1       Home Medications    Prior to Admission medications   Medication Sig Start Date End Date Taking? Authorizing Provider  albuterol (PROVENTIL HFA;VENTOLIN HFA) 108 (90 Base) MCG/ACT inhaler Inhale 1-2 puffs into the lungs every 6 (six) hours as needed for wheezing or shortness of breath. 02/28/17   Long, Arlyss RepressJoshua G, MD  HYDROcodone-acetaminophen (NORCO/VICODIN) 5-325 MG tablet Take 1-2 tablets by mouth every 6 (six) hours as needed for severe  pain. 06/12/17   Joy, Shawn C, PA-C  ibuprofen (ADVIL,MOTRIN) 600 MG tablet Take 1 tablet (600 mg total) by mouth every 6 (six) hours as needed. 06/12/17   Joy, Shawn C, PA-C  nitrofurantoin, macrocrystal-monohydrate, (MACROBID) 100 MG capsule Take 1 capsule (100 mg total) by mouth 2 (two) times daily. 02/28/17   Long, Arlyss RepressJoshua G, MD  promethazine-dextromethorphan (PROMETHAZINE-DM) 6.25-15 MG/5ML syrup Take 5 mLs by mouth 4 (four) times daily as needed for cough. 02/11/16   Fayrene Helperran, [place], PA-C    Family History Family History  Problem Relation Age of Onset  . Hypertension Mother   . Hypertension Sister   . Hypertension Maternal Uncle   . Diabetes Maternal Uncle   . Diabetes Maternal Grandmother   . Hypertension Maternal Grandmother   . Alcohol abuse Neg Hx   . Arthritis Neg Hx   . Asthma Neg Hx   . Birth defects Neg Hx   . Cancer Neg Hx   . COPD Neg Hx   . Depression Neg Hx   . Drug abuse Neg Hx   . Early death Neg Hx   . Hearing loss Neg Hx   . Heart disease Neg Hx   . Hyperlipidemia Neg Hx   . Kidney disease Neg Hx   . Learning disabilities Neg Hx   . Mental illness Neg Hx   .  Mental retardation Neg Hx   . Miscarriages / Stillbirths Neg Hx   . Stroke Neg Hx   . Vision loss Neg Hx   . Varicose Veins Neg Hx     Social History Social History  Substance Use Topics  . Smoking status: Former Smoker    Packs/day: 0.50    Years: 10.00    Types: Cigarettes    Quit date: 02/06/2014  . Smokeless tobacco: Never Used  . Alcohol use No     Allergies   Penicillins   Review of Systems Review of Systems  Gastrointestinal: Negative for nausea and vomiting.  Musculoskeletal: Positive for arthralgias and joint swelling. Negative for back pain and neck pain.  Neurological: Negative for weakness and numbness.     Physical Exam Updated Vital Signs BP (!) 140/98 (BP Location: Right Arm)   Pulse 95   Temp 97.7 F (36.5 C) (Oral)   Resp 20   Ht 5' (1.524 m)   Wt 133.8 kg (295 lb)    LMP 05/23/2017   SpO2 99%   BMI 57.61 kg/m   Physical Exam  Constitutional: She appears well-developed and well-nourished. No distress.  HENT:  Head: Normocephalic and atraumatic.  Eyes: Conjunctivae are normal.  Neck: Neck supple.  Cardiovascular: Normal rate, regular rhythm and intact distal pulses.   Pulmonary/Chest: Effort normal.  Musculoskeletal: She exhibits edema and tenderness. She exhibits no deformity.  Tenderness with swelling over the posterior fifth metatarsal and the right lateral malleolus. No noted deformity or crepitus. No noted tenderness or swelling elsewhere in the right foot. Some tenderness over the left lateral malleolus without swelling, crepitus, or deformity. Full range of motion in the left ankle and the bilateral knees. Patient is weightbearing on the left. Normal motor function intact in upper extremities and spine. No midline spinal tenderness.   Neurological: She is alert.  No sensory deficits noted in the lower extremities. Strength in the patient's bilateral toes 5/5. Patient hesitant to perform strength testing in the right ankle. Left ankle 5/5 strength.   Skin: Skin is warm and dry. Capillary refill takes less than 2 seconds. She is not diaphoretic.  Psychiatric: She has a normal mood and affect. Her behavior is normal.  Nursing note and vitals reviewed.    ED Treatments / Results  Labs (all labs ordered are listed, but only abnormal results are displayed) Labs Reviewed - No data to display  EKG  EKG Interpretation None       Radiology Dg Ankle Complete Left  Result Date: 06/12/2017 CLINICAL DATA:  Fall off third step. Bilateral ankle pain. Initial encounter. EXAM: LEFT ANKLE COMPLETE - 3+ VIEW COMPARISON:  None. FINDINGS: Nonspecific soft tissue swelling. There is no evidence of fracture, dislocation, or joint effusion. IMPRESSION: No acute osseous finding. Electronically Signed   By: Marnee Spring M.D.   On: 06/12/2017 13:46   Dg  Ankle Complete Right  Result Date: 06/12/2017 CLINICAL DATA:  Initial evaluation for acute trauma, fall. EXAM: RIGHT ANKLE - COMPLETE 3+ VIEW COMPARISON:  None. FINDINGS: There is an acute transverse fracture through the base of the fifth metatarsal without significant displacement. Overlying soft tissue swelling. No other acute fracture dislocation about the ankle. Ankle mortise approximated. Talar dome intact. Soft tissues otherwise unremarkable. IMPRESSION: 1. Acute transverse nondisplaced fracture through the base of the fifth metatarsal. 2. No other acute osseous abnormality about the ankle. Electronically Signed   By: Rise Mu M.D.   On: 06/12/2017 13:47    Procedures  Procedures (including critical care time)  Medications Ordered in ED Medications  oxyCODONE-acetaminophen (PERCOCET/ROXICET) 5-325 MG per tablet 1 tablet (1 tablet Oral Given 06/12/17 1446)  oxyCODONE-acetaminophen (PERCOCET/ROXICET) 5-325 MG per tablet 1 tablet (1 tablet Oral Given 06/12/17 1700)  ketorolac (TORADOL) injection 60 mg (60 mg Intramuscular Given 06/12/17 1700)     Initial Impression / Assessment and Plan / ED Course  I have reviewed the triage vital signs and the nursing notes.  Pertinent labs & imaging results that were available during my care of the patient were reviewed by me and considered in my medical decision making (see chart for details).     Patient presents with injuries following a fall. No abnormality noted on x-ray in the left ankle. Posterior fifth metatarsal fracture noted on the right.  We encountered some difficulty with fitting the patient with a cam walker due to her weight and leg size. We were able to have the ortho tech from Dr. Lazaro Arms office upstairs fit the patient for a larger cam walker.  Patient initially had some difficulty with use of the crutches. She was coached through this and was observed to be able to use them to remain stable on her feet.  Orthopedic follow  up. The patient was given instructions for home care as well as return precautions. Patient voices understanding of these instructions, accepts the plan, and is comfortable with discharge.    Final Clinical Impressions(s) / ED Diagnoses   Final diagnoses:  Fall, initial encounter  Closed nondisplaced fracture of fifth metatarsal bone of right foot, initial encounter    New Prescriptions New Prescriptions   HYDROCODONE-ACETAMINOPHEN (NORCO/VICODIN) 5-325 MG TABLET    Take 1-2 tablets by mouth every 6 (six) hours as needed for severe pain.   IBUPROFEN (ADVIL,MOTRIN) 600 MG TABLET    Take 1 tablet (600 mg total) by mouth every 6 (six) hours as needed.     Anselm Pancoast, PA-C 06/12/17 1724    Cathren Laine, MD 06/12/17 2118

## 2017-06-12 NOTE — Discharge Instructions (Signed)
You have been seen today for an ankle/foot injury. There was a fracture noted to the right foot.  The xray on the left ankle was normal. We suspect a sprain in this ankle. Pain: Take 600 mg of ibuprofen every 6 hours or 440 mg (over the counter dose) to 500 mg (prescription dose) of naproxen every 12 hours or for the next 3 days. After this time, these medications may be used as needed for pain. Take these medications with food to avoid upset stomach. Choose only one of these medications, do not take them together.  Tylenol: Should you continue to have additional pain while taking the ibuprofen or naproxen, you may add in tylenol as needed.  Vicodin: May take Vicodin as needed for severe pain. Do not take the Vicodin while driving or performing other dangerous activities. Note that each Vicodin pill contains 325mg  of tylenol.  Your daily total maximum amount of tylenol from all sources should be limited to 4000mg /day for persons without liver problems, or 2000mg /day for those with liver problems. Ice: May apply ice to the area over the next 24 hours for 15 minutes at a time to reduce swelling. Elevation: Keep the extremity elevated as often as possible to reduce pain and inflammation. Support: Wear the boot for support and comfort. Wear this until cleared by the orthopedic specialist. Exercises: These exercises are for the left ankle. Start by performing these exercises a few times a week, increasing the frequency until you are performing them twice daily.  Follow up: Follow-up with the orthopedic specialist as soon as possible on this matter. Call the number provided to set up an appointment.

## 2017-06-12 NOTE — ED Triage Notes (Signed)
Fall last night. She was going down steps and missed 2 steps. Both ankles twisted.

## 2017-06-12 NOTE — ED Notes (Signed)
Discharge delayed due to cam walker not fitting properly. Dr. Lazaro ArmsHudnall's office contacted for assistance.

## 2017-08-23 ENCOUNTER — Ambulatory Visit: Payer: Self-pay | Attending: Family Medicine | Admitting: Family Medicine

## 2017-08-23 ENCOUNTER — Encounter: Payer: Self-pay | Admitting: Family Medicine

## 2017-08-23 VITALS — BP 118/86 | HR 115 | Temp 97.5°F | Resp 18 | Ht 61.0 in | Wt 313.2 lb

## 2017-08-23 DIAGNOSIS — X58XXXS Exposure to other specified factors, sequela: Secondary | ICD-10-CM | POA: Insufficient documentation

## 2017-08-23 DIAGNOSIS — Z79899 Other long term (current) drug therapy: Secondary | ICD-10-CM | POA: Insufficient documentation

## 2017-08-23 DIAGNOSIS — Z87828 Personal history of other (healed) physical injury and trauma: Secondary | ICD-10-CM

## 2017-08-23 DIAGNOSIS — S92354S Nondisplaced fracture of fifth metatarsal bone, right foot, sequela: Secondary | ICD-10-CM | POA: Insufficient documentation

## 2017-08-23 DIAGNOSIS — G8929 Other chronic pain: Secondary | ICD-10-CM | POA: Insufficient documentation

## 2017-08-23 DIAGNOSIS — M79671 Pain in right foot: Secondary | ICD-10-CM

## 2017-08-23 MED ORDER — NAPROXEN 500 MG PO TABS: ORAL_TABLET | ORAL | 0 refills | Status: DC

## 2017-08-23 MED ORDER — TRAMADOL HCL 50 MG PO TABS: 50.0000 mg | ORAL_TABLET | Freq: Three times a day (TID) | ORAL | 0 refills | Status: DC | PRN

## 2017-08-23 NOTE — Progress Notes (Signed)
Subjective:  Patient ID: Dominique Anderson, female    DOB: [birth year]  Age: 31 y.o. MRN: [id number]  CC: Establish Care   HPI Dominique Anderson presents to establish care. History of fracture to right foot in July. Reports falling down the stairs. Evaluated in the emergency department, she reports boot was given.  Recommended she follow up with orthopedics. SHe denies following up. Reports pain 9-10/10 with prolonged standing and weight bearing. Associated symptoms include intermittent swelling. SHe denies any paresthesias or temperature changes to the extremity. SHe reports taking ibuprofen with minimal relief of symptoms.    Outpatient Medications Prior to Visit  Medication Sig Dispense Refill  . albuterol (PROVENTIL HFA;VENTOLIN HFA) 108 (90 Base) MCG/ACT inhaler Inhale 1-2 puffs into the lungs every 6 (six) hours as needed for wheezing or shortness of breath. (Patient not taking: Reported on 08/23/2017) 1 Inhaler 0  . HYDROcodone-acetaminophen (NORCO/VICODIN) 5-325 MG tablet Take 1-2 tablets by mouth every 6 (six) hours as needed for severe pain. (Patient not taking: Reported on 08/23/2017) 20 tablet 0  . ibuprofen (ADVIL,MOTRIN) 600 MG tablet Take 1 tablet (600 mg total) by mouth every 6 (six) hours as needed. (Patient not taking: Reported on 08/23/2017) 30 tablet 0  . nitrofurantoin, macrocrystal-monohydrate, (MACROBID) 100 MG capsule Take 1 capsule (100 mg total) by mouth 2 (two) times daily. (Patient not taking: Reported on 08/23/2017) 10 capsule 0  . promethazine-dextromethorphan (PROMETHAZINE-DM) 6.25-15 MG/5ML syrup Take 5 mLs by mouth 4 (four) times daily as needed for cough. (Patient not taking: Reported on 08/23/2017) 118 mL 0   No facility-administered medications prior to visit.     ROS Review of Systems  Constitutional: Negative.   Respiratory: Negative.   Cardiovascular: Negative.   Musculoskeletal: Positive for arthralgias and myalgias.  Skin: Negative.     Objective:  BP  118/86 (BP Location: Left Arm, Patient Position: Sitting, Cuff Size: Normal)   Pulse (!) 115   Temp (!) 97.5 F (36.4 C) (Oral)   Resp 18   Ht  (1.549 m)   Wt (!) 313 lb 3.2 oz (142.1 kg)   SpO2 98%   BMI 59.18 kg/m   BP/Weight 08/23/2017 06/12/2017 04/18/2017  Systolic BP 118 147 157  Diastolic BP 86 67 100  Wt. (Lbs) 313.2 295 295  BMI 59.18 57.61 57.61     Physical Exam  Constitutional: She appears well-developed and well-nourished.  Eyes: Pupils are equal, round, and reactive to light. Conjunctivae are normal.  Neck: No JVD present.  Cardiovascular: Normal rate, regular rhythm, normal heart sounds and intact distal pulses.   Pulmonary/Chest: Effort normal and breath sounds normal.  Abdominal: Soft. Bowel sounds are normal.  Musculoskeletal:  Bilateral foot pain right greater than left. Pain worse with dorsiflexion and inversion of feet. DP pulses +2 ; Capillary refill WNL.  Skin: Skin is warm and dry.  Nursing note and vitals reviewed.    Assessment & Plan:   1. Closed nondisplaced fracture of fifth metatarsal bone of right foot, sequela  - Ambulatory referral to Orthopedics - traMADol (ULTRAM) 50 MG tablet; Take 1 tablet (50 mg total) by mouth every 8 (eight) hours as needed for severe pain.  Dispense: 15 tablet; Refill: 0 - naproxen (NAPROSYN) 500 MG tablet; TAKE ONE TABLET BY MOUTH TWICE A DAY WITH MEALS FOR 10 DAYS. THEN TAKE ONE TABLET BY MOUTH ONCE A DAY AS NEEDED WITH MEALS.  Dispense: 60 tablet; Refill: 0  2. Chronic pain in right foot Referral to  orthopedics. Apply for orange card. - Ambulatory referral to Orthopedics - traMADol (ULTRAM) 50 MG tablet; Take 1 tablet (50 mg total) by mouth every 8 (eight) hours as needed for severe pain.  Dispense: 15 tablet; Refill: 0 - naproxen (NAPROSYN) 500 MG tablet; TAKE ONE TABLET BY MOUTH TWICE A DAY WITH MEALS FOR 10 DAYS. THEN TAKE ONE TABLET BY MOUTH ONCE A DAY AS NEEDED WITH MEALS.  Dispense: 60 tablet; Refill:  0  3. History of sprain of ankle  - naproxen (NAPROSYN) 500 MG tablet; TAKE ONE TABLET BY MOUTH TWICE A DAY WITH MEALS FOR 10 DAYS. THEN TAKE ONE TABLET BY MOUTH ONCE A DAY AS NEEDED WITH MEALS.  Dispense: 60 tablet; Refill: 0   Meds ordered this encounter  Medications  . traMADol (ULTRAM) 50 MG tablet    Sig: Take 1 tablet (50 mg total) by mouth every 8 (eight) hours as needed for severe pain.    Dispense:  15 tablet    Refill:  0    Order Specific Question:   Supervising Provider    Answer:   Quentin Angst [id number]  . naproxen (NAPROSYN) 500 MG tablet    Sig: TAKE ONE TABLET BY MOUTH TWICE A DAY WITH MEALS FOR 10 DAYS. THEN TAKE ONE TABLET BY MOUTH ONCE A DAY AS NEEDED WITH MEALS.    Dispense:  60 tablet    Refill:  0    Order Specific Question:   Supervising Provider    Answer:   Quentin Angst [id number]    Follow-up: Return if symptoms worsen or fail to improve.   Dominique Bark FNP

## 2017-08-23 NOTE — Progress Notes (Signed)
Patient is here for clearance to go back to work she is better from her foot   Patient has no  pain

## 2017-08-23 NOTE — Patient Instructions (Addendum)
Apply for orange card.   Tarsal Fracture A tarsal fracture is a break in one of the bones that are located in the middle and back of your foot (tarsals). There are seven tarsal bones in your foot that make up your heel, the arch of your foot, and connections to the long bones of your toes. Types of tarsal fractures include:  Cracks in a bone (stress fracture). This is most common in the tarsal bones of the heel and top of the foot.  A completely broken bone that has not moved out of place (nondisplaced fracture).  A completely broken bone that has moved out of place (displaced fracture).  Multiple breaks resulting in three or more bone pieces (comminuted fracture).  What are the causes? This condition may be caused by:  Repeated stress on the tarsal bones over time.  An injury that forcefully twists your foot.  Falling from a height.  A hard, direct hit or a crushing injury to your foot.  What increases the risk? This condition is more likely to develop in people who have weak bones (osteopenia orosteoporosis). You may also be at greater risk for a stress fracture if you start a new athletic activity or try to advance too quickly. Tarsal fractures are also more common in athletes who participate in:  Soccer.  Basketball.  Track or cross country.  Dancing.  Gymnastics.  Football.  What are the signs or symptoms? Foot pain is the most common symptom of this condition. The pain is typically:  Felt in the middle of your foot.  Described as achy, dull, or vague.  Better with rest and worse with activity.  Increased when you lean your body weight on your foot or rise up on tiptoe.  Other symptoms include:  Foot swelling.  Bruising.  Pain when pressing on the top or bottom of your foot (tenderness).  How is this diagnosed? This condition is diagnosed based on your symptoms and medical history, especially if you recently had an injury. Your health care provider  will also do a physical exam to check for bruising, swelling, and painful movement of your foot. You may be asked if you can stand up on tiptoe or hop. Diagnosis is usually confirmed by:  X-rays to check for a complete, displaced, or comminuted fracture.  MRI or another imaging study if your health care provider suspects a stress fracture but cannot see it on an X-ray.  How is this treated? The first treatments for stress fractures and nondisplaced fractures are usually nonsurgical. These may include:  Using a boot or cast to keep your foot still (immobilization) and protect it while it heals.  Using crutches to keep weight off your foot.  Physical therapy to improve motion and strength.  Medicine for pain.  If you have a displaced or comminuted fracture that makes your foot unstable, you may need surgery. This may include using screws, wires, or plates to provide stability. After surgery, you will need to wear a cast and eventually have physical therapy. Follow these instructions at home: If you have a boot:  Wear the boot as told by your health care provider. Remove it only as told by your health care provider.  Loosen the boot if your toes tingle, become numb, or turn cold and blue.  Do not let your boot get wet if it is not waterproof.  Keep the boot clean. If you have a cast:  Do not stick anything inside the cast to scratch your  skin. Doing that increases your risk of infection.  Check the skin around the cast every day. Tell your health care provider about any concerns.  You may put lotion on dry skin around the edges of the cast. Do not put lotion on the skin underneath the cast.  Do not let your cast get wet if it is not waterproof.  Keep the cast clean. Bathing  Do not take baths, swim, or use a hot tub until your health care provider approves. Ask your health care provider if you can take showers. You may only be allowed to take sponge baths for bathing.  If your  boot or cast is not waterproof, protect it with a watertight covering when you take a bath or a shower. Managing pain, stiffness, and swelling  If directed, apply ice to the injured area. ? Put ice in a plastic bag. ? Place a towel between the bag and your boot, cast, or skin. ? Leave the ice on for 20 minutes, 2-3 times a day.  Move your toes often to avoid stiffness and to lessen swelling.  Raise (elevate) the injured area above the level of your heart while you are sitting or lying down. Driving  Do not drive or operate heavy machinery while taking prescription pain medicine.  Ask your health care provider when it is safe to drive if you have a boot or cast on your foot. Activity  Return to your normal activities as told by your health care provider. Ask your health care provider what activities are safe for you.  Do exercises as told by your health care provider. Safety  Do not use the injured limb to support your body weight until your health care provider says that you can. Use your crutches as told by your health care provider. General instructions  Do not put pressure on any part of the cast until it is fully hardened. This may take several hours.  Do not use any tobacco products, such as cigarettes, chewing tobacco, and e-cigarettes. Tobacco can delay healing. If you need help quitting, ask your health care provider.  Take over-the-counter medicines, prescription medicines, and vitamin supplements only as told by your health care provider.  Keep all follow-up visits as told by your health care provider. This is important. How is this prevented?  Give your body time to rest between periods of activity.  Wear comfortable and supportive footwear when being active.  If you are starting a new activity, build your time or distance gradually.  Make sure to use equipment that fits you.  Be safe and responsible while being active to avoid falls.  Maintain physical  fitness, including: ? Strength. ? Flexibility.  Participate in alternative physical activities (cross train) to avoid over-stressing one area of your body.  Include foods that contain calcium and vitamin D as part of a balanced diet to support strong bones. These include milk, beef liver, egg yolks, fatty fish, soybeans, dark leafy greens, cheese, and fortified cereals. Contact a health care provider if:  Your pain medicine is not helping.  You cannot do your home care exercises because of pain or stiffness.  Your boot or cast gets damaged. Get help right away if:  You have severe pain.  Your toes turn pale and cold or blue.  You lose feeling in your toes (have numbness).  You have pain, redness, warmth, and tenderness in the back of your leg.  You have chest pain or difficulty breathing. This information is not  intended to replace advice given to you by your health care provider. Make sure you discuss any questions you have with your health care provider. Document Released: 11/14/2005 Document Revised: 07/20/2016 Document Reviewed: 10/27/2015 Elsevier Interactive Patient Education  [employer].

## 2017-11-27 ENCOUNTER — Encounter (HOSPITAL_BASED_OUTPATIENT_CLINIC_OR_DEPARTMENT_OTHER): Payer: Self-pay | Admitting: *Deleted

## 2017-11-27 ENCOUNTER — Emergency Department (HOSPITAL_BASED_OUTPATIENT_CLINIC_OR_DEPARTMENT_OTHER)
Admission: EM | Admit: 2017-11-27 | Discharge: 2017-11-27 | Disposition: A | Discharge status: Home / Self Care | Payer: No Typology Code available for payment source | Attending: Physician Assistant | Admitting: Physician Assistant

## 2017-11-27 DIAGNOSIS — M542 Cervicalgia: Secondary | ICD-10-CM | POA: Diagnosis not present

## 2017-11-27 DIAGNOSIS — Z79899 Other long term (current) drug therapy: Secondary | ICD-10-CM | POA: Insufficient documentation

## 2017-11-27 DIAGNOSIS — Z041 Encounter for examination and observation following transport accident: Secondary | ICD-10-CM | POA: Diagnosis not present

## 2017-11-27 DIAGNOSIS — Z87891 Personal history of nicotine dependence: Secondary | ICD-10-CM | POA: Insufficient documentation

## 2017-11-27 DIAGNOSIS — R51 Headache: Secondary | ICD-10-CM | POA: Insufficient documentation

## 2017-11-27 MED ORDER — IBUPROFEN 600 MG PO TABS: 600.0000 mg | ORAL_TABLET | Freq: Four times a day (QID) | ORAL | 0 refills | Status: DC | PRN

## 2017-11-27 MED ORDER — METHOCARBAMOL 500 MG PO TABS: 500.0000 mg | ORAL_TABLET | Freq: Two times a day (BID) | ORAL | 0 refills | Status: DC

## 2017-11-27 MED ORDER — LIDOCAINE 5 % EX PTCH: 1.0000 | MEDICATED_PATCH | CUTANEOUS | 0 refills | Status: DC

## 2017-11-27 NOTE — Discharge Instructions (Signed)
Expect your soreness to increase over the next 2-3 days. Take it easy, but do not lay around too much as this may make any stiffness worse.  Antiinflammatory medications: Take 600 mg of ibuprofen every 6 hours or 440 mg (over the counter dose) to 500 mg (prescription dose) of naproxen every 12 hours for the next 3 days. After this time, these medications may be used as needed for pain. Take these medications with food to avoid upset stomach. Choose only one of these medications, do not take them together.  Tylenol: Should you continue to have additional pain while taking the ibuprofen or naproxen, you may add in tylenol as needed. Your daily total maximum amount of tylenol from all sources should be limited to 4000mg /day for persons without liver problems, or 2000mg /day for those with liver problems. Muscle relaxer: Robaxin is a muscle relaxer and may help loosen stiff muscles. Do not take the Robaxin while driving or performing other dangerous activities.  Lidocaine patches: These are available via either prescription or over-the-counter. The over-the-counter option may be more economical one and are likely just as effective. There are multiple over-the-counter brands, such as Salonpas. Exercises: Be sure to perform the attached exercises starting with three times a week and working up to performing them daily. This is an essential part of preventing long term problems.   Follow up with a primary care provider for any future management of these complaints.   Head Injury You have been seen today for a head injury. It does not appear to be serious at this time.  Close observation: The close observation period is usually 6 hours from the injury. This includes staying awake and having a trustworthy adult monitor you to assure your condition does not worsen. You should be in regular contact with this person and ideally, they should be able to monitor you in person.  Secondary observation: The secondary  observation period is usually 24 hours from the injury. You are allowed to sleep during this time. A trustworthy adult should intermittently monitor you to assure your condition does not worsen.   Overall head injury/concussion care: Rest: Be sure to get plenty of rest. You will need more rest and sleep while you recover. Hydration: Be sure to stay well hydrated by having a goal of drinking about 0.5 liters of water an hour. Pain:  Antiinflammatory medications: Take 600 mg of ibuprofen every 6 hours or 440 mg (over the counter dose) to 500 mg (prescription dose) of naproxen every 12 hours or for the next 3 days. After this time, these medications may be used as needed for pain. Take these medications with food to avoid upset stomach. Choose only one of these medications, do not take them together. Tylenol: Should you continue to have additional pain while taking the ibuprofen or naproxen, you may add in tylenol as needed. Your daily total maximum amount of tylenol from all sources should be limited to 4000mg /day for persons without liver problems, or 2000mg /day for those with liver problems. Return to sports and activities: In general, you may return to normal activities once symptoms have subsided, however, you would ideally be cleared by a primary care provider or other qualified medical professional prior to return to these activities.  Follow up: Follow up with the concussion clinic or your primary care provider for further management of this issue. Return: Return to the ED should you begin to have confusion, abnormal behavior, aggression, violence, or personality changes, repeated vomiting, vision loss, numbness or  weakness on one side of the body, difficulty standing due to dizziness, significantly worsening pain, or any other major concerns. ° °

## 2017-11-27 NOTE — ED Notes (Signed)
ED Provider at bedside. 
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
## 2017-12-29 ENCOUNTER — Other Ambulatory Visit: Payer: Self-pay

## 2017-12-29 ENCOUNTER — Encounter (HOSPITAL_BASED_OUTPATIENT_CLINIC_OR_DEPARTMENT_OTHER): Payer: Self-pay

## 2017-12-29 ENCOUNTER — Emergency Department (HOSPITAL_BASED_OUTPATIENT_CLINIC_OR_DEPARTMENT_OTHER)
Admission: EM | Admit: 2017-12-29 | Discharge: 2017-12-29 | Disposition: A | Discharge status: Home / Self Care | Payer: Self-pay | Attending: Emergency Medicine | Admitting: Emergency Medicine

## 2017-12-29 DIAGNOSIS — Z87891 Personal history of nicotine dependence: Secondary | ICD-10-CM | POA: Insufficient documentation

## 2017-12-29 DIAGNOSIS — J111 Influenza due to unidentified influenza virus with other respiratory manifestations: Secondary | ICD-10-CM

## 2017-12-29 DIAGNOSIS — J101 Influenza due to other identified influenza virus with other respiratory manifestations: Secondary | ICD-10-CM | POA: Insufficient documentation

## 2017-12-29 MED ORDER — ONDANSETRON 4 MG PO TBDP
4.0000 mg | ORAL_TABLET | Freq: Once | ORAL | Status: AC
  Administered 2017-12-29: 4 mg via ORAL
  Filled 2017-12-29: qty 1

## 2017-12-29 MED ORDER — ACETAMINOPHEN 500 MG PO TABS
1000.0000 mg | ORAL_TABLET | Freq: Once | ORAL | Status: AC
  Administered 2017-12-29: 1000 mg via ORAL
  Filled 2017-12-29: qty 2

## 2017-12-29 MED ORDER — BENZONATATE 100 MG PO CAPS: 100.0000 mg | ORAL_CAPSULE | Freq: Three times a day (TID) | ORAL | 0 refills | Status: DC | PRN

## 2017-12-29 MED ORDER — KETOROLAC TROMETHAMINE 60 MG/2ML IM SOLN
60.0000 mg | Freq: Once | INTRAMUSCULAR | Status: AC
  Administered 2017-12-29: 60 mg via INTRAMUSCULAR
  Filled 2017-12-29: qty 2

## 2017-12-29 MED ORDER — ONDANSETRON 4 MG PO TBDP: 4.0000 mg | ORAL_TABLET | Freq: Three times a day (TID) | ORAL | 0 refills | Status: DC | PRN

## 2017-12-29 MED ORDER — OSELTAMIVIR PHOSPHATE 75 MG PO CAPS: 75.0000 mg | ORAL_CAPSULE | Freq: Two times a day (BID) | ORAL | 0 refills | Status: AC

## 2017-12-29 NOTE — ED Triage Notes (Signed)
C/o n/v started last night-also c/o cough, body aches-NAD-steady gait-last tylenol 6am

## 2017-12-29 NOTE — ED Provider Notes (Signed)
[facility] EMERGENCY DEPARTMENT Provider Note   CSN: [id number] Arrival date & time: 12/29/17  1150     History   Chief Complaint Chief Complaint  Patient presents with  . Emesis    HPI Dominique Anderson is a 32 y.o. female.  HPI   Presents with concern for fever 101.2, vomiting, body aches, chills, cough, no congestion.  No dysuria.  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  Symptoms began last night. Took tyelnol at 6AM today.  Diarrhea.  LMP Jan4, reports not pregnant.   Past Medical History:  Diagnosis Date  . Gestational diabetes   . Morbid obesity (HCC)   . PONV (postoperative nausea and vomiting)   . Pregnancy induced hypertension 2011   resolved with delivery    Patient Active Problem List   Diagnosis Date Noted  . Status post repeat low transverse cesarean section 02/02/2015    Past Surgical History:  Procedure Laterality Date  . CESAREAN SECTION  2011  . CESAREAN SECTION N/A 02/02/2015   Procedure: CESAREAN SECTION;  Surgeon: Essie Hart, MD;  Location: [facility];  Service: Obstetrics;  Laterality: N/A;    OB History    Gravida Para Term Preterm AB Living   2 2 2     1    SAB TAB Ectopic Multiple Live Births         0 1       Home Medications    Prior to Admission medications   Medication Sig Start Date End Date Taking? Authorizing Provider  benzonatate (TESSALON) 100 MG capsule Take 1 capsule (100 mg total) by mouth 3 (three) times daily as needed for cough. 12/29/17   Alvira Monday, MD  ondansetron (ZOFRAN ODT) 4 MG disintegrating tablet Take 1 tablet (4 mg total) by mouth every 8 (eight) hours as needed for nausea or vomiting. 12/29/17   Alvira Monday, MD  oseltamivir (TAMIFLU) 75 MG capsule Take 1 capsule (75 mg total) by mouth every 12 (twelve) hours for 5 days. 12/29/17 01/03/18  Alvira Monday, MD    Family History Family History  Problem Relation Age of Onset  . Hypertension Mother   . Hypertension Sister   .  Hypertension Maternal Uncle   . Diabetes Maternal Uncle   . Diabetes Maternal Grandmother   . Hypertension Maternal Grandmother   . Alcohol abuse Neg Hx   . Arthritis Neg Hx   . Asthma Neg Hx   . Birth defects Neg Hx   . Cancer Neg Hx   . COPD Neg Hx   . Depression Neg Hx   . Drug abuse Neg Hx   . Early death Neg Hx   . Hearing loss Neg Hx   . Heart disease Neg Hx   . Hyperlipidemia Neg Hx   . Kidney disease Neg Hx   . Learning disabilities Neg Hx   . Mental illness Neg Hx   . Mental retardation Neg Hx   . Miscarriages / Stillbirths Neg Hx   . Stroke Neg Hx   . Vision loss Neg Hx   . Varicose Veins Neg Hx     Social History Social History   Tobacco Use  . Smoking status: Former Smoker    Packs/day: 0.50    Years: 10.00    Pack years: 5.00    Types: Cigarettes    Last attempt to quit: 02/06/2014    Years since quitting: 3.8  . Smokeless tobacco: Never Used  Substance Use Topics  .  Alcohol use: No  . Drug use: No     Allergies   Hydrocodone and Penicillins   Review of Systems Review of Systems  Constitutional: Positive for appetite change, chills, fatigue and fever.  HENT: Positive for sore throat. Negative for congestion.   Eyes: Negative for visual disturbance.  Respiratory: Positive for cough. Negative for shortness of breath.   Cardiovascular: Negative for chest pain.  Gastrointestinal: Positive for diarrhea, nausea and vomiting. Negative for abdominal pain.  Genitourinary: Negative for difficulty urinating and dysuria.  Musculoskeletal: Positive for arthralgias and myalgias. Negative for back pain and neck pain.  Skin: Negative for rash.  Neurological: Negative for syncope and headaches.     Physical Exam Updated Vital Signs BP (!) 125/96   Pulse 98   Temp 98.3 F (36.8 C) (Oral)   Resp (!) 22   Ht 5\' 5"  (1.651 m)   Wt (!) 140.8 kg (310 lb 6.5 oz)   LMP 12/01/2017   SpO2 99%   BMI 51.65 kg/m   Physical Exam  Constitutional: She is  oriented to person, place, and time. She appears well-developed and well-nourished. No distress.  HENT:  Head: Normocephalic and atraumatic.  Eyes: Conjunctivae and EOM are normal.  Neck: Normal range of motion.  Cardiovascular: Normal rate, regular rhythm, normal heart sounds and intact distal pulses. Exam reveals no gallop and no friction rub.  No murmur heard. Pulmonary/Chest: Effort normal and breath sounds normal. No respiratory distress. She has no wheezes. She has no rales.  Abdominal: Soft. She exhibits no distension. There is no tenderness. There is no guarding.  Musculoskeletal: She exhibits no edema or tenderness.  Neurological: She is alert and oriented to person, place, and time.  Skin: Skin is warm and dry. No rash noted. She is not diaphoretic. No erythema.  Nursing note and vitals reviewed.    ED Treatments / Results  Labs (all labs ordered are listed, but only abnormal results are displayed) Labs Reviewed - No data to display  EKG  EKG Interpretation None       Radiology No results found.  Procedures Procedures (including critical care time)  Medications Ordered in ED Medications  ondansetron (ZOFRAN-ODT) disintegrating tablet 4 mg (4 mg Oral Given 12/29/17 1306)  ketorolac (TORADOL) injection 60 mg (60 mg Intramuscular Given 12/29/17 1304)  acetaminophen (TYLENOL) tablet 1,000 mg (1,000 mg Oral Given 12/29/17 1304)     Initial Impression / Assessment and Plan / ED Course  I have reviewed the triage vital signs and the nursing notes.  Pertinent labs & imaging results that were available during my care of the patient were reviewed by me and considered in my medical decision making (see chart for details).     32 year old female with a history of gestational diabetes and gestational hypertension, obesity, presents with concern for cough, body aches, fevers, vomiting and diarrhea.  Has flu contacts.  Patient with clear breath sounds bilaterally, given duration  of symptoms, have low suspicion for pneumonia.   History and physical exam consistent with influenza.  Given symptoms started last night, do not feel that labs will change course of care at this time, do not suspect significant abnormalities.  Discussed this with patient, and gave options for labs/fluids or discharge with po medications to help control symptoms and po hydration.  Pt would like medications for supportive care.  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 Gave rx and pt will decide based on expense.  Gave rx for zofran, tessalon, tamiflu and rec tylenol/ibuprofen, hydration, supportive care.  Final Clinical Impressions(s) / ED Diagnoses   Final diagnoses:  Influenza    ED Discharge Orders        Ordered    ondansetron (ZOFRAN ODT) 4 MG disintegrating tablet  Every 8 hours PRN     12/29/17 1258    oseltamivir (TAMIFLU) 75 MG capsule  Every 12 hours     12/29/17 1259    benzonatate (TESSALON) 100 MG capsule  3 times daily PRN     12/29/17 1259       Alvira Monday, MD 12/29/17 1817

## 2018-02-19 ENCOUNTER — Other Ambulatory Visit: Payer: Self-pay

## 2018-02-19 ENCOUNTER — Encounter (HOSPITAL_BASED_OUTPATIENT_CLINIC_OR_DEPARTMENT_OTHER): Payer: Self-pay | Admitting: *Deleted

## 2018-02-19 ENCOUNTER — Emergency Department (HOSPITAL_BASED_OUTPATIENT_CLINIC_OR_DEPARTMENT_OTHER)
Admission: EM | Admit: 2018-02-19 | Discharge: 2018-02-19 | Disposition: A | Discharge status: Home / Self Care | Payer: Self-pay | Attending: Emergency Medicine | Admitting: Emergency Medicine

## 2018-02-19 DIAGNOSIS — M5431 Sciatica, right side: Secondary | ICD-10-CM | POA: Insufficient documentation

## 2018-02-19 DIAGNOSIS — Z87891 Personal history of nicotine dependence: Secondary | ICD-10-CM | POA: Insufficient documentation

## 2018-02-19 LAB — URINALYSIS, ROUTINE W REFLEX MICROSCOPIC
BILIRUBIN URINE: NEGATIVE
Glucose, UA: NEGATIVE mg/dL
Hgb urine dipstick: NEGATIVE
Ketones, ur: NEGATIVE mg/dL
NITRITE: NEGATIVE
Protein, ur: 30 mg/dL — AB
Specific Gravity, Urine: >1.03 — ABNORMAL HIGH (ref 1.005–1.030)
pH: 6 (ref 5.0–8.0)

## 2018-02-19 LAB — URINALYSIS, MICROSCOPIC (REFLEX)

## 2018-02-19 LAB — PREGNANCY, URINE: PREG TEST UR: NEGATIVE

## 2018-02-19 MED ORDER — METHOCARBAMOL 500 MG PO TABS: 500.0000 mg | ORAL_TABLET | Freq: Three times a day (TID) | ORAL | 0 refills | Status: DC | PRN

## 2018-02-19 MED ORDER — TRAMADOL HCL 50 MG PO TABS: 50.0000 mg | ORAL_TABLET | Freq: Two times a day (BID) | ORAL | 0 refills | Status: DC | PRN

## 2018-02-19 MED ORDER — NAPROXEN 500 MG PO TABS: 500.0000 mg | ORAL_TABLET | Freq: Two times a day (BID) | ORAL | 0 refills | Status: AC

## 2018-02-19 MED FILL — traMADol HCL 50 MG TABS: 50 | 4 days supply | Qty: 8 | Fill #0

## 2018-02-19 MED FILL — METHOCARBAMOL 500 MG TABS: 500 | 7 days supply | Qty: 20 | Fill #0

## 2018-02-19 NOTE — Discharge Instructions (Addendum)
Avoid walking, sitting, or prolonged standing.  Slowly increase activity as tolerated.  Sciatic nerve stretch is called "nerve glides" really available on the Internet.  You may start these as your symptoms improve.  Do not overdo it on your first few days.

## 2018-02-19 NOTE — ED Notes (Signed)
Pt ambulated to treatment area with steady gate.

## 2018-02-19 NOTE — ED Provider Notes (Signed)
[facility] EMERGENCY DEPARTMENT Provider Note   CSN: [id number] Arrival date & time: 02/19/18  1148     History   Chief Complaint Chief Complaint  Patient presents with  . Back Pain    HPI Dominique Anderson is a 32 y.o. female.  Complaint is back and hip pain.  HPI: Dominique Anderson is [age].  She works in [employer].  She states "I know I am overweight".  She does a lot of lifting.  She occasionally gets back pain.  Typically "Tylenol does it".  She has had pain in her right buttock radiating slightly to her low back primarily posteriorly down her leg midline in the right.  She walks with a limp due to pain.  But no weakness.  Does not go to the floor to any particular part of her lower leg.  No bowel or bladder changes.  No incontinence, or retention of all her bladder today.  No history of radiculopathy or lumbar spine or disc disease.  No fall, injury, or trauma  Past Medical History:  Diagnosis Date  . Gestational diabetes   . Morbid obesity (HCC)   . PONV (postoperative nausea and vomiting)   . Pregnancy induced hypertension 2011   resolved with delivery    Patient Active Problem List   Diagnosis Date Noted  . Status post repeat low transverse cesarean section 02/02/2015    Past Surgical History:  Procedure Laterality Date  . CESAREAN SECTION  2011  . CESAREAN SECTION N/A 02/02/2015   Procedure: CESAREAN SECTION;  Surgeon: Essie HartWalda Pinn, MD;  Location: [facility];  Service: Obstetrics;  Laterality: N/A;     OB History    Gravida  2   Para  2   Term  2   Preterm      AB      Living  1     SAB      TAB      Ectopic      Multiple  0   Live Births  1            Home Medications    Prior to Admission medications   Medication Sig Start Date End Date Taking? Authorizing Provider  benzonatate (TESSALON) 100 MG capsule Take 1 capsule (100 mg total) by mouth 3 (three) times daily as needed for cough. 12/29/17   Alvira MondaySchlossman, Erin, MD  methocarbamol (ROBAXIN) 500  MG tablet Take 1 tablet (500 mg total) by mouth 3 (three) times daily between meals as needed. 02/19/18   Rolland PorterJames, Shneur Whitten[place], MD  naproxen (NAPROSYN) 500 MG tablet Take 1 tablet (500 mg total) by mouth 2 (two) times daily. 02/19/18   Rolland PorterJames, Emari Demmer, MD  ondansetron (ZOFRAN ODT) 4 MG disintegrating tablet Take 1 tablet (4 mg total) by mouth every 8 (eight) hours as needed for nausea or vomiting. 12/29/17   Alvira MondaySchlossman, Erin, MD  traMADol (ULTRAM) 50 MG tablet Take 1 tablet (50 mg total) by mouth every 12 (twelve) hours as needed for moderate pain. 02/19/18   Rolland PorterJames, Priscila Bean, MD    Family History Family History  Problem Relation Age of Onset  . Hypertension Mother   . Hypertension Sister   . Hypertension Maternal Uncle   . Diabetes Maternal Uncle   . Diabetes Maternal Grandmother   . Hypertension Maternal Grandmother   . Alcohol abuse Neg Hx   . Arthritis Neg Hx   . Asthma Neg Hx   . Birth defects Neg Hx   . Cancer Neg Hx   .  COPD Neg Hx   . Depression Neg Hx   . Drug abuse Neg Hx   . Early death Neg Hx   . Hearing loss Neg Hx   . Heart disease Neg Hx   . Hyperlipidemia Neg Hx   . Kidney disease Neg Hx   . Learning disabilities Neg Hx   . Mental illness Neg Hx   . Mental retardation Neg Hx   . Miscarriages / Stillbirths Neg Hx   . Stroke Neg Hx   . Vision loss Neg Hx   . Varicose Veins Neg Hx     Social History Social History   Tobacco Use  . Smoking status: Former Smoker    Packs/day: 0.50    Years: 10.00    Pack years: 5.00    Types: Cigarettes    Last attempt to quit: 02/06/2014    Years since quitting: 4.0  . Smokeless tobacco: Never Used  Substance Use Topics  . Alcohol use: No  . Drug use: No     Allergies   Hydrocodone and Penicillins   Review of Systems Review of Systems  Constitutional: Negative for appetite change, chills, diaphoresis, fatigue and fever.  HENT: Negative for mouth sores, sore throat and trouble swallowing.   Eyes: Negative for visual disturbance.    Respiratory: Negative for cough, chest tightness, shortness of breath and wheezing.   Cardiovascular: Negative for chest pain.  Gastrointestinal: Negative for abdominal distention, abdominal pain, diarrhea, nausea and vomiting.  Endocrine: Negative for polydipsia, polyphagia and polyuria.  Genitourinary: Negative for dysuria, frequency and hematuria.  Musculoskeletal: Positive for arthralgias, back pain and myalgias. Negative for gait problem.  Skin: Negative for color change, pallor and rash.  Neurological: Negative for dizziness, syncope, light-headedness and headaches.  Hematological: Does not bruise/bleed easily.  Psychiatric/Behavioral: Negative for behavioral problems and confusion.     Physical Exam Updated Vital Signs BP (!) 160/108 (BP Location: Left Arm)   Pulse 90   Temp 98.4 F (36.9 C) (Oral)   Resp (!) 22   Ht 5' (1.524 m)   Wt (!) 141.5 kg (312 lb)   LMP 02/12/2018   SpO2 98%   BMI 60.93 kg/m   Physical Exam  Constitutional: She is oriented to person, place, and time. She appears well-developed and well-nourished. No distress.  HENT:  Head: Normocephalic.  Eyes: Pupils are equal, round, and reactive to light. Conjunctivae are normal. No scleral icterus.  Neck: Normal range of motion. Neck supple. No thyromegaly present.  Cardiovascular: Normal rate and regular rhythm. Exam reveals no gallop and no friction rub.  No murmur heard. Pulmonary/Chest: Effort normal and breath sounds normal. No respiratory distress. She has no wheezes. She has no rales.  Abdominal: Soft. Bowel sounds are normal. She exhibits no distension. There is no tenderness. There is no rebound.  Musculoskeletal:  This in the sciatic notch.  This reproduces pain posterior aspect of her lower leg.  Her pattern of pain does not follow a radicular pattern.  She has normal reflexes.  She is normal strength to flex and extend the knee, plantarflexion/dorsiflexion of the foot.  Normal sensation.   Neurological: She is alert and oriented to person, place, and time.  Skin: Skin is warm and dry. No rash noted.  Psychiatric: She has a normal mood and affect. Her behavior is normal.     ED Treatments / Results  Labs (all labs ordered are listed, but only abnormal results are displayed) Labs Reviewed  URINALYSIS, ROUTINE W REFLEX MICROSCOPIC -  Abnormal; Notable for the following components:      Result Value   APPearance CLOUDY (*)    Specific Gravity, Urine >1.030 (*)    Protein, ur 30 (*)    Leukocytes, UA SMALL (*)    All other components within normal limits  URINALYSIS, MICROSCOPIC (REFLEX) - Abnormal; Notable for the following components:   Bacteria, UA MANY (*)    Squamous Epithelial / LPF TOO NUMEROUS TO COUNT (*)    All other components within normal limits  PREGNANCY, URINE    EKG None  Radiology No results found.  Procedures Procedures (including critical care time)  Medications Ordered in ED Medications - No data to display   Initial Impression / Assessment and Plan / ED Course  I have reviewed the triage vital signs and the nursing notes.  Pertinent labs & imaging results that were available during my care of the patient were reviewed by me and considered in my medical decision making (see chart for details).    Anti-inflammatory, muscle relaxants, tramadol.  Avoiding prolonged sitting, standing and walking.  Work note.  Nerve glides-instructed.  Final Clinical Impressions(s) / ED Diagnoses   Final diagnoses:  Sciatica of right side    ED Discharge Orders        Ordered    methocarbamol (ROBAXIN) 500 MG tablet  3 times daily between meals PRN     02/19/18 1546    naproxen (NAPROSYN) 500 MG tablet  2 times daily     02/19/18 1546    traMADol (ULTRAM) 50 MG tablet  Every 12 hours PRN     02/19/18 1546       Rolland Porter, MD 02/19/18 1550

## 2018-02-19 NOTE — ED Triage Notes (Signed)
Back pain. States she feels she pulled a muscle. Pain goes down to her right hip. She is ambulatory.

## 2018-05-06 ENCOUNTER — Encounter (HOSPITAL_BASED_OUTPATIENT_CLINIC_OR_DEPARTMENT_OTHER): Payer: Self-pay | Admitting: Emergency Medicine

## 2018-05-06 ENCOUNTER — Emergency Department (HOSPITAL_BASED_OUTPATIENT_CLINIC_OR_DEPARTMENT_OTHER)
Admission: EM | Admit: 2018-05-06 | Discharge: 2018-05-06 | Disposition: A | Discharge status: Home / Self Care | Payer: Medicaid Other | Attending: Emergency Medicine | Admitting: Emergency Medicine

## 2018-05-06 ENCOUNTER — Other Ambulatory Visit: Payer: Self-pay

## 2018-05-06 DIAGNOSIS — Z79899 Other long term (current) drug therapy: Secondary | ICD-10-CM | POA: Insufficient documentation

## 2018-05-06 DIAGNOSIS — Z87891 Personal history of nicotine dependence: Secondary | ICD-10-CM | POA: Insufficient documentation

## 2018-05-06 DIAGNOSIS — H109 Unspecified conjunctivitis: Secondary | ICD-10-CM | POA: Insufficient documentation

## 2018-05-06 MED ORDER — TOBRAMYCIN 0.3 % OP SOLN
1.0000 [drp] | Freq: Once | OPHTHALMIC | Status: AC
Start: 2018-05-06 — End: 2018-05-06
  Administered 2018-05-06: 1 [drp] via OPHTHALMIC
  Filled 2018-05-06: qty 5

## 2018-05-06 MED ORDER — CLINDAMYCIN HCL 150 MG PO CAPS: 300.0000 mg | ORAL_CAPSULE | Freq: Four times a day (QID) | ORAL | 0 refills | Status: DC

## 2018-05-06 NOTE — ED Triage Notes (Signed)
Patient states that her daughter was being treated for pink eye and patient started to use her daughters pink eye medication on Friday when her eyes became red. PAtient now has bilateral periorbital swelling, drainage  - hemorrhaging   in her scelera

## 2018-05-06 NOTE — ED Provider Notes (Signed)
[facility] EMERGENCY DEPARTMENT Provider Note   CSN: [id number] Arrival date & time: 05/06/18  1545     History   Chief Complaint Chief Complaint  Patient presents with  . Eye Problem    HPI Dominique Anderson is a 32 y.o. female.  Patient who does not wear corrective or contact lenses presents to the emergency department with bilateral red eyes ongoing for several days.  Patient reports that pink eye has been going around her house.  IN fact, at bedside, is a  family member who has a red eye as well.  Patient states that she used her daughter's pink eye medication (Polytrim) and this did not help symptoms so she presents today.  She reports eyes being itchy.  She has had some minor pain around her eyes especially the left side which is worse.  She reports swelling to her bilateral eyelids.  No fevers, nausea or vomiting.  No injury to the areas or foreign bodies in the eyes.  No other treatments prior to arrival.  No vision change.  She reports having to peel her eyes open in the mornings because of the matting.  Course is constant.      Past Medical History:  Diagnosis Date  . Gestational diabetes   . Morbid obesity (HCC)   . PONV (postoperative nausea and vomiting)   . Pregnancy induced hypertension 2011   resolved with delivery    Patient Active Problem List   Diagnosis Date Noted  . Status post repeat low transverse cesarean section 02/02/2015    Past Surgical History:  Procedure Laterality Date  . CESAREAN SECTION  2011  . CESAREAN SECTION N/A 02/02/2015   Procedure: CESAREAN SECTION;  Surgeon: Essie Hart, MD;  Location: [facility];  Service: Obstetrics;  Laterality: N/A;     OB History    Gravida  2   Para  2   Term  2   Preterm      AB      Living  1     SAB      TAB      Ectopic      Multiple  0   Live Births  1            Home Medications    Prior to Admission medications   Medication Sig Start Date End Date Taking? Authorizing  Provider  benzonatate (TESSALON) 100 MG capsule Take 1 capsule (100 mg total) by mouth 3 (three) times daily as needed for cough. 12/29/17   Alvira Monday, MD  clindamycin (CLEOCIN) 150 MG capsule Take 2 capsules (300 mg total) by mouth every 6 (six) hours. 05/06/18   Renne Crigler, PA-C  methocarbamol (ROBAXIN) 500 MG tablet Take 1 tablet (500 mg total) by mouth 3 (three) times daily between meals as needed. 02/19/18   Rolland Porter, MD  naproxen (NAPROSYN) 500 MG tablet Take 1 tablet (500 mg total) by mouth 2 (two) times daily. 02/19/18   Rolland Porter, MD  ondansetron (ZOFRAN ODT) 4 MG disintegrating tablet Take 1 tablet (4 mg total) by mouth every 8 (eight) hours as needed for nausea or vomiting. 12/29/17   Alvira Monday, MD  traMADol (ULTRAM) 50 MG tablet Take 1 tablet (50 mg total) by mouth every 12 (twelve) hours as needed for moderate pain. 02/19/18   Rolland Porter, MD    Family History Family History  Problem Relation Age of Onset  . Hypertension Mother   . Hypertension Sister   .  Hypertension Maternal Uncle   . Diabetes Maternal Uncle   . Diabetes Maternal Grandmother   . Hypertension Maternal Grandmother   . Alcohol abuse Neg Hx   . Arthritis Neg Hx   . Asthma Neg Hx   . Birth defects Neg Hx   . Cancer Neg Hx   . COPD Neg Hx   . Depression Neg Hx   . Drug abuse Neg Hx   . Early death Neg Hx   . Hearing loss Neg Hx   . Heart disease Neg Hx   . Hyperlipidemia Neg Hx   . Kidney disease Neg Hx   . Learning disabilities Neg Hx   . Mental illness Neg Hx   . Mental retardation Neg Hx   . Miscarriages / Stillbirths Neg Hx   . Stroke Neg Hx   . Vision loss Neg Hx   . Varicose Veins Neg Hx     Social History Social History   Tobacco Use  . Smoking status: Former Smoker    Packs/day: 0.50    Years: 10.00    Pack years: 5.00    Types: Cigarettes    Last attempt to quit: 02/06/2014    Years since quitting: 4.2  . Smokeless tobacco: Never Used  Substance Use Topics  . Alcohol  use: No  . Drug use: No     Allergies   Hydrocodone and Penicillins   Review of Systems Review of Systems  Constitutional: Negative for fever.  HENT: Positive for facial swelling. Negative for ear pain, rhinorrhea and sore throat.   Eyes: Positive for pain, discharge, redness and itching. Negative for photophobia and visual disturbance.  Respiratory: Negative for cough.   Cardiovascular: Negative for chest pain.  Gastrointestinal: Negative for abdominal pain, diarrhea, nausea and vomiting.  Genitourinary: Negative for dysuria.  Musculoskeletal: Negative for myalgias.  Skin: Negative for rash.  Neurological: Negative for headaches.     Physical Exam Updated Vital Signs BP (!) 146/106 (BP Location: Left Arm)   Pulse (!) 102   Temp 97.7 F (36.5 C) (Oral)   Resp 18   Ht 5' (1.524 m)   Wt (!) 139.3 kg (307 lb)   LMP 05/06/2018   SpO2 100%   BMI 59.96 kg/m   Physical Exam  Constitutional: She appears well-developed and well-nourished.  HENT:  Head: Normocephalic and atraumatic.  Eyes: Pupils are equal, round, and reactive to light. Right eye exhibits chemosis and discharge. Left eye exhibits chemosis and discharge. Right conjunctiva is injected. Right conjunctiva has no hemorrhage. Left conjunctiva is injected. Left conjunctiva has a hemorrhage. Right eye exhibits normal extraocular motion and no nystagmus. Left eye exhibits normal extraocular motion and no nystagmus.  Slit lamp exam:      The right eye shows no foreign body.       The left eye shows no foreign body.  Neck: Normal range of motion. Neck supple.  Pulmonary/Chest: No respiratory distress.  Neurological: She is alert.  Skin: Skin is warm and dry.  Psychiatric: She has a normal mood and affect.  Nursing note and vitals reviewed.    ED Treatments / Results  Labs (all labs ordered are listed, but only abnormal results are displayed) Labs Reviewed - No data to display  EKG None  Radiology No results  found.  Procedures Procedures (including critical care time)  Medications Ordered in ED Medications  tobramycin (TOBREX) 0.3 % ophthalmic solution 1 drop (1 drop Both Eyes Given 05/06/18 1638)     Initial  Impression / Assessment and Plan / ED Course  I have reviewed the triage vital signs and the nursing notes.  Pertinent labs & imaging results that were available during my care of the patient were reviewed by me and considered in my medical decision making (see chart for details).     Patient seen and examined.  Eyedrops ordered.  Vital signs reviewed and are as follows: BP (!) 146/106 (BP Location: Left Arm)   Pulse (!) 102   Temp 97.7 F (36.5 C) (Oral)   Resp 18   Ht 5' (1.524 m)   Wt (!) 139.3 kg (307 lb)   LMP 05/06/2018   SpO2 100%   BMI 59.96 kg/m       Visual Acuity  Right Eye Distance: 20/50 Left Eye Distance: 20/50 Bilateral Distance: 20/50  Right Eye Near:   Left Eye Near:    Bilateral Near:     Patient counseled on hygiene.  Encouraged ophthalmologic follow-up in 2 days if not improving with her drops here.  Encourage patient to return with fever, worsening swelling of the face or around the eye, vision changes or loss.  Patient verbalizes understanding and agrees with plan.  Final Clinical Impressions(s) / ED Diagnoses   Final diagnoses:  Bacterial conjunctivitis   Patient with exam and history strongly suspicious for bacterial conjunctivitis.  Patient's daughter had similar symptoms and there is a family member at bedside who currently has a red draining eye as well.  She has lid edema but overall low suspicion for periorbital cellulitis.  Patient has tried Polytrim drops without improvement.  She is not a contact lens wearer however will change to Tobrex drops as I have those to send her home with.  We will also place on clindamycin to cover for early periorbital cellulitis.  Patient is not febrile.  Visual acuity is not significantly decreased.  No  foreign bodies noted.  Exam not consistent with orbital cellulitis. No signs of iritis. No signs of glaucoma. No symptoms of retinal detachment. No ophthalmologic emergency suspected. Outpatient referral given in case of no improvement.    ED Discharge Orders        Ordered    clindamycin (CLEOCIN) 150 MG capsule  Every 6 hours     05/06/18 1645       Renne Crigler, PA-C 05/06/18 1655    Derwood Kaplan, MD 05/06/18 [phone]

## 2018-05-06 NOTE — Discharge Instructions (Signed)
Please read and follow all provided instructions.  Your diagnoses today include:  1. Bacterial conjunctivitis     Tests performed today include:  Vital signs. See below for your results today.   Medications prescribed:   Tobrex (tobramycin) - antibiotic eye drops or eye ointment  Use this medication as follows:  Use 1-2 drops in affected eye every 4 hours while awake for 5 days.   Clindamycin - antibiotic  You have been prescribed an antibiotic medicine: take the entire course of medicine even if you are feeling better. Stopping early can cause the antibiotic not to work.  Take any prescribed medications only as directed.  Home care instructions:  Follow any educational materials contained in this packet. If you wear contact lenses, do not use them until your eye caregiver approves. Follow-up care is necessary to be sure the infection is healing if not completely resolved in 2-3 days. See your caregiver or eye specialist as suggested for followup.   If you have an eye infection, wash your hands often as this is very contagious and is easily spread from person to person.   Follow-up instructions: Please follow-up with the opthalmologist listed in the next 2 days for further evaluation of your symptoms if you are not improving.  Return instructions:   Please return to the Emergency Department if you experience worsening symptoms.   Please return immediately if you develop severe pain, pus drainage, new change in vision, or fever.  Please return if you have any other emergent concerns.  Additional Information:  Your vital signs today were: BP (!) 146/106 (BP Location: Left Arm)    Pulse (!) 102    Temp 97.7 F (36.5 C) (Oral)    Resp 18    Ht 5' (1.524 m)    Wt (!) 139.3 kg (307 lb)    LMP 05/06/2018    SpO2 100%    BMI 59.96 kg/m  If your blood pressure (BP) was elevated above 135/85 this visit, please have this repeated by your doctor within one  month. ---------------

## 2018-07-31 ENCOUNTER — Encounter (HOSPITAL_COMMUNITY): Payer: Self-pay | Admitting: Emergency Medicine

## 2018-07-31 ENCOUNTER — Emergency Department (HOSPITAL_COMMUNITY)
Admission: EM | Admit: 2018-07-31 | Discharge: 2018-08-01 | Disposition: A | Discharge status: Home / Self Care | Payer: Medicaid Other | Attending: Emergency Medicine | Admitting: Emergency Medicine

## 2018-07-31 ENCOUNTER — Other Ambulatory Visit: Payer: Self-pay

## 2018-07-31 DIAGNOSIS — Z87891 Personal history of nicotine dependence: Secondary | ICD-10-CM | POA: Insufficient documentation

## 2018-07-31 DIAGNOSIS — J019 Acute sinusitis, unspecified: Secondary | ICD-10-CM

## 2018-07-31 DIAGNOSIS — Z79899 Other long term (current) drug therapy: Secondary | ICD-10-CM | POA: Insufficient documentation

## 2018-07-31 NOTE — ED Triage Notes (Signed)
Pt reports that 2 days she started developing body aches, chills, cough, and sinus symptoms. 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

## 2018-08-01 MED ORDER — BENZONATATE 100 MG PO CAPS: 100.0000 mg | ORAL_CAPSULE | Freq: Three times a day (TID) | ORAL | 0 refills | Status: AC | PRN

## 2018-08-01 MED ORDER — IBUPROFEN 600 MG PO TABS: 600.0000 mg | ORAL_TABLET | Freq: Four times a day (QID) | ORAL | 0 refills | Status: AC | PRN

## 2018-08-01 MED ORDER — CETIRIZINE-PSEUDOEPHEDRINE ER 5-120 MG PO TB12: 1.0000 | ORAL_TABLET | Freq: Two times a day (BID) | ORAL | 0 refills | Status: AC

## 2018-08-01 MED ORDER — LORATADINE 10 MG PO TABS
10.0000 mg | ORAL_TABLET | Freq: Once | ORAL | Status: AC
  Administered 2018-08-01: 10 mg via ORAL
  Filled 2018-08-01: qty 1

## 2018-08-01 MED ORDER — FLUTICASONE PROPIONATE 50 MCG/ACT NA SUSP: 2.0000 | Freq: Every day | NASAL | 0 refills | Status: AC

## 2018-08-01 MED ORDER — KETOROLAC TROMETHAMINE 60 MG/2ML IM SOLN
60.0000 mg | Freq: Once | INTRAMUSCULAR | Status: AC
  Administered 2018-08-01: 60 mg via INTRAMUSCULAR
  Filled 2018-08-01: qty 2

## 2018-08-01 NOTE — ED Notes (Signed)
Patient Alert and oriented to baseline. Stable and ambulatory to baseline. Patient verbalized understanding of the discharge instructions.  Patient belongings were taken by the patient.   

## 2018-08-01 NOTE — Discharge Instructions (Signed)
We recommend the use of Zyrtec-D for congestion.  You would also benefit from the use of Flonase.  Take Tessalon as needed for cough.  We recommend ibuprofen for body aches.  Drink plenty of fluids to prevent dehydration.  Follow-up with a primary care doctor to ensure resolution of symptoms.  Should you develop fever over 101 F, persistent or worsening facial pain or sinus congestion, or symptoms lasting longer than 1 week you may be indicated to receive antibiotics for further management.

## 2018-08-01 NOTE — ED Provider Notes (Signed)
[facility] EMERGENCY DEPARTMENT Provider Note   CSN: [id number] Arrival date & time: 07/31/18  2301     History   Chief Complaint Chief Complaint  Patient presents with  . Influenza    HPI Dominique Anderson is a 32 y.o. female.  The history is provided by the patient. No language interpreter was used.  URI   This is a new problem. The current episode started yesterday. The problem has been gradually worsening. There has been no fever. Associated symptoms include congestion, plugged ear sensation, sinus pain, sore throat and cough. Pertinent negatives include no vomiting and no wheezing. Treatments tried: Mucinex. The treatment provided no relief.    Past Medical History:  Diagnosis Date  . Gestational diabetes   . Morbid obesity (HCC)   . PONV (postoperative nausea and vomiting)   . Pregnancy induced hypertension 2011   resolved with delivery    Patient Active Problem List   Diagnosis Date Noted  . Status post repeat low transverse cesarean section 02/02/2015    Past Surgical History:  Procedure Laterality Date  . CESAREAN SECTION  2011  . CESAREAN SECTION N/A 02/02/2015   Procedure: CESAREAN SECTION;  Surgeon: Essie Hart, MD;  Location: [facility];  Service: Obstetrics;  Laterality: N/A;     OB History    Gravida  2   Para  2   Term  2   Preterm      AB      Living  1     SAB      TAB      Ectopic      Multiple  0   Live Births  1            Home Medications    Prior to Admission medications   Medication Sig Start Date End Date Taking? Authorizing Provider  benzonatate (TESSALON) 100 MG capsule Take 1 capsule (100 mg total) by mouth 3 (three) times daily as needed for cough. 08/01/18   Antony Madura, PA-C  cetirizine-pseudoephedrine (ZYRTEC-D) 5-120 MG tablet Take 1 tablet by mouth 2 (two) times daily. 08/01/18   Antony Madura, PA-C  clindamycin (CLEOCIN) 150 MG capsule Take 2 capsules (300 mg total) by mouth every 6 (six) hours.  05/06/18   Renne Crigler, PA-C  fluticasone (FLONASE) 50 MCG/ACT nasal spray Place 2 sprays into both nostrils daily. 08/01/18   Antony Madura, PA-C  ibuprofen (ADVIL,MOTRIN) 600 MG tablet Take 1 tablet (600 mg total) by mouth every 6 (six) hours as needed for mild pain or moderate pain. 08/01/18   Antony Madura, PA-C  methocarbamol (ROBAXIN) 500 MG tablet Take 1 tablet (500 mg total) by mouth 3 (three) times daily between meals as needed. 02/19/18   Rolland Porter, MD  naproxen (NAPROSYN) 500 MG tablet Take 1 tablet (500 mg total) by mouth 2 (two) times daily. 02/19/18   Rolland Porter, MD  ondansetron (ZOFRAN ODT) 4 MG disintegrating tablet Take 1 tablet (4 mg total) by mouth every 8 (eight) hours as needed for nausea or vomiting. 12/29/17   Alvira Monday, MD  traMADol (ULTRAM) 50 MG tablet Take 1 tablet (50 mg total) by mouth every 12 (twelve) hours as needed for moderate pain. 02/19/18   Rolland Porter, MD    Family History Family History  Problem Relation Age of Onset  . Hypertension Mother   . Hypertension Sister   . Hypertension Maternal Uncle   . Diabetes Maternal Uncle   . Diabetes Maternal Grandmother   .  Hypertension Maternal Grandmother   . Alcohol abuse Neg Hx   . Arthritis Neg Hx   . Asthma Neg Hx   . Birth defects Neg Hx   . Cancer Neg Hx   . COPD Neg Hx   . Depression Neg Hx   . Drug abuse Neg Hx   . Early death Neg Hx   . Hearing loss Neg Hx   . Heart disease Neg Hx   . Hyperlipidemia Neg Hx   . Kidney disease Neg Hx   . Learning disabilities Neg Hx   . Mental illness Neg Hx   . Mental retardation Neg Hx   . Miscarriages / Stillbirths Neg Hx   . Stroke Neg Hx   . Vision loss Neg Hx   . Varicose Veins Neg Hx     Social History Social History   Tobacco Use  . Smoking status: Former Smoker    Packs/day: 0.50    Years: 10.00    Pack years: 5.00    Types: Cigarettes    Last attempt to quit: 02/06/2014    Years since quitting: 4.4  . Smokeless tobacco: Never Used    Substance Use Topics  . Alcohol use: No  . Drug use: No     Allergies   Hydrocodone and Penicillins   Review of Systems Review of Systems  HENT: Positive for congestion, sinus pain and sore throat.   Respiratory: Positive for cough. Negative for wheezing.   Gastrointestinal: Negative for vomiting.  Ten systems reviewed and are negative for acute change, except as noted in the HPI.    Physical Exam Updated Vital Signs BP 135/88 (BP Location: Right Arm)   Pulse 76   Temp (!) 97.3 F (36.3 C) (Oral)   Resp 18   Ht 5' (1.524 m)   Wt (!) 141.1 kg   LMP 07/30/2018   SpO2 100%   BMI 60.74 kg/m   Physical Exam  Constitutional: She is oriented to person, place, and time. She appears well-developed and well-nourished. No distress.  Nontoxic appearing and in NAD  HENT:  Head: Normocephalic and atraumatic.  Right Ear: Tympanic membrane, external ear and ear canal normal.  Left Ear: Tympanic membrane, external ear and ear canal normal.  Nose: Mucosal edema present. Right sinus exhibits maxillary sinus tenderness and frontal sinus tenderness. Left sinus exhibits maxillary sinus tenderness and frontal sinus tenderness.  Eyes: Conjunctivae and EOM are normal. No scleral icterus.  Neck: Normal range of motion.  No meningismus  Cardiovascular: Normal rate, regular rhythm and intact distal pulses.  Pulmonary/Chest: Effort normal. No stridor. No respiratory distress. She has no wheezes. She has no rales.  Lungs CTAB. Respirations even and unlabored.  Musculoskeletal: Normal range of motion.  Neurological: She is alert and oriented to person, place, and time. She exhibits normal muscle tone. Coordination normal.  Skin: Skin is warm and dry. No rash noted. She is not diaphoretic. No erythema. No pallor.  Psychiatric: She has a normal mood and affect. Her behavior is normal.  Nursing note and vitals reviewed.    ED Treatments / Results  Labs (all labs ordered are listed, but only  abnormal results are displayed) Labs Reviewed - No data to display  EKG None  Radiology No results found.  Procedures Procedures (including critical care time)  Medications Ordered in ED Medications  ketorolac (TORADOL) injection 60 mg (60 mg Intramuscular Given 08/01/18 0312)  loratadine (CLARITIN) tablet 10 mg (10 mg Oral Given 08/01/18 0312)  Initial Impression / Assessment and Plan / ED Course  I have reviewed the triage vital signs and the nursing notes.  Pertinent labs & imaging results that were available during my care of the patient were reviewed by me and considered in my medical decision making (see chart for details).     Patient complaining of symptoms of sinusitis.  Mild to moderate symptoms of clear/yellow nasal discharge/congestion and scratchy throat with cough for less than 10 days.  Patient is afebrile.  No concern for acute bacterial rhinosinusitis; likely viral in nature.  Patient discharged with symptomatic treatment.  Patient given instructions for warm saline nasal washes.  Recommendations for follow-up with primary care physician.  Return precautions discussed and provided.  Patient discharged in stable condition with no unaddressed concerns.   Final Clinical Impressions(s) / ED Diagnoses   Final diagnoses:  Acute non-recurrent sinusitis, unspecified location    ED Discharge Orders         Ordered    ibuprofen (ADVIL,MOTRIN) 600 MG tablet  Every 6 hours PRN     08/01/18 0258    fluticasone (FLONASE) 50 MCG/ACT nasal spray  Daily     08/01/18 0258    cetirizine-pseudoephedrine (ZYRTEC-D) 5-120 MG tablet  2 times daily     08/01/18 0258    benzonatate (TESSALON) 100 MG capsule  3 times daily PRN     08/01/18 0258           Antony Madura, PA-C 08/01/18 [id number]    Azalia Bilis, MD 08/01/18 [phone]

## 2018-09-06 ENCOUNTER — Encounter (HOSPITAL_BASED_OUTPATIENT_CLINIC_OR_DEPARTMENT_OTHER): Payer: Self-pay | Admitting: Emergency Medicine

## 2018-09-06 ENCOUNTER — Emergency Department (HOSPITAL_BASED_OUTPATIENT_CLINIC_OR_DEPARTMENT_OTHER)
Admission: EM | Admit: 2018-09-06 | Discharge: 2018-09-06 | Disposition: A | Discharge status: Home / Self Care | Payer: Self-pay | Attending: Emergency Medicine | Admitting: Emergency Medicine

## 2018-09-06 ENCOUNTER — Other Ambulatory Visit: Payer: Self-pay

## 2018-09-06 DIAGNOSIS — H5789 Other specified disorders of eye and adnexa: Secondary | ICD-10-CM

## 2018-09-06 DIAGNOSIS — J209 Acute bronchitis, unspecified: Secondary | ICD-10-CM

## 2018-09-06 DIAGNOSIS — Z87891 Personal history of nicotine dependence: Secondary | ICD-10-CM | POA: Insufficient documentation

## 2018-09-06 DIAGNOSIS — Z79899 Other long term (current) drug therapy: Secondary | ICD-10-CM | POA: Insufficient documentation

## 2018-09-06 MED ORDER — OFLOXACIN 0.3 % OP SOLN
1.0000 [drp] | Freq: Four times a day (QID) | OPHTHALMIC | Status: DC
  Administered 2018-09-06: 1 [drp] via OPHTHALMIC
  Filled 2018-09-06: qty 5

## 2018-09-06 MED ORDER — ALBUTEROL SULFATE HFA 108 (90 BASE) MCG/ACT IN AERS
2.0000 | INHALATION_SPRAY | RESPIRATORY_TRACT | Status: DC | PRN
  Administered 2018-09-06: 2 via RESPIRATORY_TRACT
  Filled 2018-09-06: qty 6.7

## 2018-09-06 NOTE — ED Provider Notes (Signed)
[facility] Provider Note: Dominique Dell, MD, FACEP  CSN: [id number] MRN: [id number] ARRIVAL: 09/06/18 at 0204 ROOM: [facility]   CHIEF COMPLAINT  Cough   HISTORY OF PRESENT ILLNESS  09/06/18 2:22 AM Dominique Anderson is a 32 y.o. female with a 2-day history of a cough.  The cough is nonproductive and moderate to severe in intensity.  It is persistent and is keeping her awake at night.  It is worse when trying to take a deep breath and she is having difficulty taking a deep breath.  She has not had an associated fever.  She has had some drainage and irritation of the left eye with edema and tenderness of the left upper eyelid.   Past Medical History:  Diagnosis Date  . Gestational diabetes   . Morbid obesity (HCC)   . PONV (postoperative nausea and vomiting)   . Pregnancy induced hypertension 2011   resolved with delivery    Past Surgical History:  Procedure Laterality Date  . CESAREAN SECTION  2011  . CESAREAN SECTION N/A 02/02/2015   Procedure: CESAREAN SECTION;  Surgeon: Essie Hart, MD;  Location: [facility];  Service: Obstetrics;  Laterality: N/A;    Family History  Problem Relation Age of Onset  . Hypertension Mother   . Hypertension Sister   . Hypertension Maternal Uncle   . Diabetes Maternal Uncle   . Diabetes Maternal Grandmother   . Hypertension Maternal Grandmother   . Alcohol abuse Neg Hx   . Arthritis Neg Hx   . Asthma Neg Hx   . Birth defects Neg Hx   . Cancer Neg Hx   . COPD Neg Hx   . Depression Neg Hx   . Drug abuse Neg Hx   . Early death Neg Hx   . Hearing loss Neg Hx   . Heart disease Neg Hx   . Hyperlipidemia Neg Hx   . Kidney disease Neg Hx   . Learning disabilities Neg Hx   . Mental illness Neg Hx   . Mental retardation Neg Hx   . Miscarriages / Stillbirths Neg Hx   . Stroke Neg Hx   . Vision loss Neg Hx   . Varicose Veins Neg Hx     Social History   Tobacco Use  . Smoking status: Former Smoker    Packs/day: 0.50    Years:  10.00    Pack years: 5.00    Types: Cigarettes    Last attempt to quit: 02/06/2014    Years since quitting: 4.5  . Smokeless tobacco: Never Used  Substance Use Topics  . Alcohol use: No  . Drug use: No    Prior to Admission medications   Medication Sig Start Date End Date Taking? Authorizing Provider  benzonatate (TESSALON) 100 MG capsule Take 1 capsule (100 mg total) by mouth 3 (three) times daily as needed for cough. 08/01/18   Antony Madura, PA-C  cetirizine-pseudoephedrine (ZYRTEC-D) 5-120 MG tablet Take 1 tablet by mouth 2 (two) times daily. 08/01/18   Antony Madura, PA-C  clindamycin (CLEOCIN) 150 MG capsule Take 2 capsules (300 mg total) by mouth every 6 (six) hours. 05/06/18   Renne Crigler, PA-C  fluticasone (FLONASE) 50 MCG/ACT nasal spray Place 2 sprays into both nostrils daily. 08/01/18   Antony Madura, PA-C  ibuprofen (ADVIL,MOTRIN) 600 MG tablet Take 1 tablet (600 mg total) by mouth every 6 (six) hours as needed for mild pain or moderate pain. 08/01/18   Antony Madura, PA-C  methocarbamol (  ROBAXIN) 500 MG tablet Take 1 tablet (500 mg total) by mouth 3 (three) times daily between meals as needed. 02/19/18   Rolland Porter, MD  naproxen (NAPROSYN) 500 MG tablet Take 1 tablet (500 mg total) by mouth 2 (two) times daily. 02/19/18   Rolland Porter, MD  ondansetron (ZOFRAN ODT) 4 MG disintegrating tablet Take 1 tablet (4 mg total) by mouth every 8 (eight) hours as needed for nausea or vomiting. 12/29/17   Alvira Monday, MD  traMADol (ULTRAM) 50 MG tablet Take 1 tablet (50 mg total) by mouth every 12 (twelve) hours as needed for moderate pain. 02/19/18   Rolland Porter, MD    Allergies Hydrocodone and Penicillins   REVIEW OF SYSTEMS  Negative except as noted here or in the History of Present Illness.   PHYSICAL EXAMINATION  Initial Vital Signs Blood pressure (!) 163/121, pulse (!) 106, temperature 98.6 F (37 C), temperature source Oral, resp. rate 20, height 5' (1.524 m), weight (!) 141.5 kg, SpO2  98 %, unknown if currently breastfeeding.  Examination General: Well-developed, well-nourished female in no acute distress; appearance consistent with age of record HENT: normocephalic; atraumatic Eyes: pupils equal, round and reactive to light; extraocular muscles intact; tenderness and mild edema of left upper eyelid Neck: supple Heart: regular rate and rhythm Lungs: clear to auscultation bilaterally but shallow breaths and coughing on attempted deep breathing Abdomen: soft; nondistended; nontender; bowel sounds present Extremities: No deformity; full range of motion; pulses normal Neurologic: Awake, alert and oriented; motor function intact in all extremities and symmetric; no facial droop Skin: Warm and dry Psychiatric: Normal mood and affect   RESULTS  Summary of this visit's results, reviewed by myself:   EKG Interpretation  Date/Time:    Ventricular Rate:    PR Interval:    QRS Duration:   QT Interval:    QTC Calculation:   R Axis:     Text Interpretation:        Laboratory Studies: No results found for this or any previous visit (from the past 24 hour(s)). Imaging Studies: No results found.  ED COURSE and MDM  Nursing notes and initial vitals signs, including pulse oximetry, reviewed.  Vitals:   09/06/18 0218  BP: (!) 163/121  Pulse: (!) 106  Resp: 20  Temp: 98.6 F (37 C)  TempSrc: Oral  SpO2: 98%  Weight: (!) 141.5 kg  Height: 5' (1.524 m)   We will give the patient an inhaler as her symptoms are consistent with acute bronchitis.  We will treat her left eye discomfort with antibiotic drops.  This may represent an early conjunctivitis.  PROCEDURES    ED DIAGNOSES     ICD-10-CM   1. Acute bronchitis with bronchospasm J20.9   2. Irritation of left eye H57.89        Kalea Perine, Jonny Ruiz, MD 09/06/18 [phone]

## 2018-09-06 NOTE — ED Triage Notes (Signed)
Pt c/o cough intermittent x 3 weeks. Pt also c/o discharge and soreness to left eye.

## 2018-12-03 ENCOUNTER — Emergency Department (HOSPITAL_BASED_OUTPATIENT_CLINIC_OR_DEPARTMENT_OTHER): Payer: Self-pay

## 2018-12-03 ENCOUNTER — Other Ambulatory Visit: Payer: Self-pay

## 2018-12-03 ENCOUNTER — Emergency Department (HOSPITAL_BASED_OUTPATIENT_CLINIC_OR_DEPARTMENT_OTHER)
Admission: EM | Admit: 2018-12-03 | Discharge: 2018-12-03 | Disposition: A | Discharge status: Home / Self Care | Payer: Self-pay | Attending: Emergency Medicine | Admitting: Emergency Medicine

## 2018-12-03 ENCOUNTER — Encounter (HOSPITAL_BASED_OUTPATIENT_CLINIC_OR_DEPARTMENT_OTHER): Payer: Self-pay | Admitting: Emergency Medicine

## 2018-12-03 DIAGNOSIS — Z79899 Other long term (current) drug therapy: Secondary | ICD-10-CM | POA: Insufficient documentation

## 2018-12-03 DIAGNOSIS — Z87891 Personal history of nicotine dependence: Secondary | ICD-10-CM | POA: Insufficient documentation

## 2018-12-03 DIAGNOSIS — J111 Influenza due to unidentified influenza virus with other respiratory manifestations: Secondary | ICD-10-CM | POA: Insufficient documentation

## 2018-12-03 DIAGNOSIS — R69 Illness, unspecified: Secondary | ICD-10-CM

## 2018-12-03 MED ORDER — GUAIFENESIN 100 MG/5ML PO SYRP: 100.0000 mg | ORAL_SOLUTION | ORAL | 0 refills | Status: AC | PRN

## 2018-12-03 MED ORDER — OSELTAMIVIR PHOSPHATE 75 MG PO CAPS: 75.0000 mg | ORAL_CAPSULE | Freq: Two times a day (BID) | ORAL | 0 refills | Status: AC

## 2018-12-03 NOTE — ED Notes (Signed)
Patient transported to X-ray 

## 2018-12-03 NOTE — Discharge Instructions (Addendum)
You were evaluated today for influenza-like symptoms.  I have prescribed you Tamiflu.  Please take as prescribed.  If you develop nausea, vomiting, diarrhea please stop taking this medication.  Please follow-up with your PCP for reevaluation.  Please do not return to work until you are 24 hours without a fever without use of Tylenol.  Return to the ED for any new or worsening symptoms.

## 2018-12-03 NOTE — ED Provider Notes (Signed)
[facility] EMERGENCY DEPARTMENT Provider Note   CSN: [id number] Arrival date & time: 12/03/18  1303   History   Chief Complaint Chief Complaint  Patient presents with  . Generalized Body Aches    HPI Dominique Anderson is a 33 y.o. female with past medical history significant for morbid obesity, who presents for evaluation of fever, body aches and pains, cough, rhinorrhea and nasal congestion.  Symptom onset yesterday.  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  Denies influenza vaccine.  Does not take anything for symptoms PTA.  Patient states she did have one episode of nonbloody, nonbilious emesis yesterday morning.  Has been able to tolerate p.o. intake without difficulty and has noted no subsequent episodes of emesis.  Denies chills, neck pain, neck stiffness, chest pain, shortness of breath, abdominal pain, diarrhea, dysuria.  History obtained from patient.  No interpreter was used.  HPI  Past Medical History:  Diagnosis Date  . Gestational diabetes   . Morbid obesity (HCC)   . PONV (postoperative nausea and vomiting)   . Pregnancy induced hypertension 2011   resolved with delivery    Patient Active Problem List   Diagnosis Date Noted  . Status post repeat low transverse cesarean section 02/02/2015    Past Surgical History:  Procedure Laterality Date  . CESAREAN SECTION  2011  . CESAREAN SECTION N/A 02/02/2015   Procedure: CESAREAN SECTION;  Surgeon: Essie Hart, MD;  Location: [facility];  Service: Obstetrics;  Laterality: N/A;     OB History    Gravida  2   Para  2   Term  2   Preterm      AB      Living  1     SAB      TAB      Ectopic      Multiple  0   Live Births  1            Home Medications    Prior to Admission medications   Medication Sig Start Date End Date Taking? Authorizing Provider  benzonatate (TESSALON) 100 MG capsule Take 1 capsule (100 mg total) by mouth 3 (three) times  daily as needed for cough. 08/01/18   Antony Madura, PA-C  cetirizine-pseudoephedrine (ZYRTEC-D) 5-120 MG tablet Take 1 tablet by mouth 2 (two) times daily. 08/01/18   Antony Madura, PA-C  fluticasone (FLONASE) 50 MCG/ACT nasal spray Place 2 sprays into both nostrils daily. 08/01/18   Antony Madura, PA-C  guaifenesin (ROBITUSSIN) 100 MG/5ML syrup Take 5-10 mLs (100-200 mg total) by mouth every 4 (four) hours as needed for cough. 12/03/18   Gabryel Files A, PA-C  ibuprofen (ADVIL,MOTRIN) 600 MG tablet Take 1 tablet (600 mg total) by mouth every 6 (six) hours as needed for mild pain or moderate pain. 08/01/18   Antony Madura, PA-C  methocarbamol (ROBAXIN) 500 MG tablet Take 1 tablet (500 mg total) by mouth 3 (three) times daily between meals as needed. 02/19/18   Rolland Porter, MD  naproxen (NAPROSYN) 500 MG tablet Take 1 tablet (500 mg total) by mouth 2 (two) times daily. 02/19/18   Rolland Porter, MD  ondansetron (ZOFRAN ODT) 4 MG disintegrating tablet Take 1 tablet (4 mg total) by mouth every 8 (eight) hours as needed for nausea or vomiting. 12/29/17   Alvira Monday, MD  oseltamivir (TAMIFLU) 75 MG capsule Take 1 capsule (75 mg total) by mouth 2 (two) times daily for  5 days. 12/03/18 12/08/18  Gannon Heinzman A, PA-C  traMADol (ULTRAM) 50 MG tablet Take 1 tablet (50 mg total) by mouth every 12 (twelve) hours as needed for moderate pain. 02/19/18   Rolland PorterJames, Mark, MD    Family History Family History  Problem Relation Age of Onset  . Hypertension Mother   . Hypertension Sister   . Hypertension Maternal Uncle   . Diabetes Maternal Uncle   . Diabetes Maternal Grandmother   . Hypertension Maternal Grandmother   . Alcohol abuse Neg Hx   . Arthritis Neg Hx   . Asthma Neg Hx   . Birth defects Neg Hx   . Cancer Neg Hx   . COPD Neg Hx   . Depression Neg Hx   . Drug abuse Neg Hx   . Early death Neg Hx   . Hearing loss Neg Hx   . Heart disease Neg Hx   . Hyperlipidemia Neg Hx   . Kidney disease Neg Hx   . Learning  disabilities Neg Hx   . Mental illness Neg Hx   . Mental retardation Neg Hx   . Miscarriages / Stillbirths Neg Hx   . Stroke Neg Hx   . Vision loss Neg Hx   . Varicose Veins Neg Hx     Social History Social History   Tobacco Use  . Smoking status: Former Smoker    Packs/day: 0.50    Years: 10.00    Pack years: 5.00    Types: Cigarettes    Last attempt to quit: 02/06/2014    Years since quitting: 4.8  . Smokeless tobacco: Never Used  Substance Use Topics  . Alcohol use: No  . Drug use: No     Allergies   Hydrocodone and Penicillins   Review of Systems Review of Systems  Constitutional: Positive for fever.  HENT: Positive for congestion, postnasal drip and rhinorrhea. Negative for drooling, ear discharge, ear pain, facial swelling, hearing loss, mouth sores, nosebleeds, sinus pressure, sinus pain, sneezing, sore throat, tinnitus, trouble swallowing and voice change.   Respiratory: Positive for cough. Negative for choking, chest tightness, shortness of breath, wheezing and stridor.   Cardiovascular: Negative.   Gastrointestinal: Positive for vomiting. Negative for abdominal distention, abdominal pain, anal bleeding, blood in stool, constipation, diarrhea, nausea and rectal pain.  Genitourinary: Negative.   Musculoskeletal: Negative.   Skin: Negative.   Neurological: Negative.   All other systems reviewed and are negative.    Physical Exam Updated Vital Signs BP (!) 131/80   Pulse (!) 100   Temp 98.7 F (37.1 C) (Oral)   Resp 18   Ht 5\' 5"  (1.651 m)   Wt (!) 147.4 kg   LMP 12/02/2018 (Approximate)   SpO2 98%   BMI 54.08 kg/m   Physical Exam Vitals signs and nursing note reviewed.  Constitutional:      General: She is not in acute distress.    Appearance: She is well-developed. She is not ill-appearing, toxic-appearing or diaphoretic.     Comments: Patient eating crackers in bed on initial evaluation.  Appears in no acute distress.  HENT:     Head:  Normocephalic and atraumatic.     Jaw: There is normal jaw occlusion.     Right Ear: Tympanic membrane, ear canal and external ear normal. There is no impacted cerumen. Tympanic membrane is not perforated, erythematous, retracted or bulging.     Left Ear: Tympanic membrane, ear canal and external ear normal. There is no impacted cerumen.  Tympanic membrane is not perforated, erythematous, retracted or bulging.     Nose: Congestion and rhinorrhea present.     Right Turbinates: Enlarged.     Left Turbinates: Enlarged.     Right Sinus: No maxillary sinus tenderness or frontal sinus tenderness.     Left Sinus: No maxillary sinus tenderness or frontal sinus tenderness.     Comments: Rhinorrhea to bilateral nares.  Swollen turbinates.    Mouth/Throat:     Lips: Pink. No lesions.     Mouth: Mucous membranes are moist.     Tongue: No lesions.     Pharynx: Oropharynx is clear. Uvula midline. No pharyngeal swelling, oropharyngeal exudate, posterior oropharyngeal erythema or uvula swelling.     Tonsils: No tonsillar exudate or tonsillar abscesses. Swelling: 0 on the right. 0 on the left.     Comments: Oropharynx with erythema without exudate.  Uvula midline without deviation.  Tonsils without edema or exudate.  No evidence of PTA, RPA.  No oral pharyngeal lesions.  Mucous membranes moist. Eyes:     Pupils: Pupils are equal, round, and reactive to light.  Neck:     Musculoskeletal: Full passive range of motion without pain and normal range of motion.     Comments: No neck stiffness or rigidity.  Phonation normal.  Able to tolerate oral secretions without difficulty.  No Cervical lymphadenopathy. Cardiovascular:     Rate and Rhythm: Normal rate.     Heart sounds: Normal heart sounds.     Comments: Tachycardic at 100 bpm. Pulmonary:     Effort: No respiratory distress.     Comments: Lungs clear to auscultation bilaterally without wheeze, rhonchi or rales.  No tachypnea.  No accessory muscle usage.  No  evidence of acute respiratory distress.  Able to speak in full sentences without difficulty. Abdominal:     General: There is no distension.     Comments: Soft, nontender without rebound or guarding.  Musculoskeletal: Normal range of motion.     Comments: Moves all extremities without difficulty.  Skin:    General: Skin is warm and dry.  Neurological:     Mental Status: She is alert.      ED Treatments / Results  Labs (all labs ordered are listed, but only abnormal results are displayed) Labs Reviewed - No data to display  EKG None  Radiology Dg Chest 2 View  Result Date: 12/03/2018 CLINICAL DATA:  Cough and shortness of breath EXAM: CHEST - 2 VIEW COMPARISON:  Apr 18, 2017 FINDINGS: There is no edema or consolidation. Heart is upper normal in size with pulmonary vascularity normal. No adenopathy. No bone lesions evident. IMPRESSION: No edema or consolidation. Electronically Signed   By: Bretta Bang III M.D.   On: 12/03/2018 16:03    Procedures Procedures (including critical care time)  Medications Ordered in ED Medications - No data to display   Initial Impression / Assessment and Plan / ED Course  I have reviewed the triage vital signs and the nursing notes.  Pertinent labs & imaging results that were available during my care of the patient were reviewed by me and considered in my medical decision making (see chart for details).  33 year old female who appears otherwise well presents for evaluation of multiple complaints.  Patient has had subjective fever, body aches and pains, rhinorrhea, congestion, cough over the last day.  Patient does work at a daycare and has had multiple sick contacts.  Did not receive influenza vaccine.  Patient states her  cough is been keeping her up at night.  No history of CHF, lower extremity swelling.  No PND or orthopnea.  Afebrile, nonseptic, non-ill-appearing.  She was mildly tachycardic in 120s in triage.  Patient does look mildly  dehydrated.  I have offered IV fluids.  Patient declines IV fluids at this time and would like oral rehydration.  Lungs clear to auscultation bilaterally no wheeze, rhonchi or rales.  Abdomen soft, nontender without rebound or guarding.  She did have one episode none bloody, nonbilious emesis yesterday morning, however has been able to tolerate p.o. intake without difficulty without subsequent episodes of emesis.  No abdominal pain.  Patient likely with influenza-like illness.  Decision-making conversation with patient whether to test and treat for influenza with Tamiflu.  Patient requesting Tamiflu given recent contacts. Chest x-ray negative for infiltrates, CHF, pulmonary edema or pneumothorax.  She is not appear in any acute respiratory distress.  Heart rate improved to 100 with oral rehydration.  Low suspicion for emergent pathology causing patient's symptoms at this time.  She has had mild elevation in her blood pressure in department.  She is asymptomatic without any headache, vision changes, chest pain, shortness of breath, nausea, vomiting, abdominal pain.  I discussed with patient follow-up with PCP for reevaluation of her blood pressure.  Patient hemodynamically stable and appropriate for DC home at this time.  I discussed return precautions.  Patient was understanding and agreeable for follow-up.    Final Clinical Impressions(s) / ED Diagnoses   Final diagnoses:  Influenza-like illness    ED Discharge Orders         Ordered    oseltamivir (TAMIFLU) 75 MG capsule  2 times daily     12/03/18 1714    guaifenesin (ROBITUSSIN) 100 MG/5ML syrup  Every 4 hours PRN     12/03/18 1714           Keyondra Lagrand A, PA-C 12/03/18 1746    Jacalyn LefevreHaviland, Julie, MD 12/03/18 1845

## 2018-12-03 NOTE — ED Triage Notes (Signed)
Generalized body aches with cough and vomiting since last night.

## 2018-12-03 NOTE — ED Notes (Signed)
Pt c/o generalized body aches, cough, congestion and nausea with an episode of vomiting and diarrhea this morning. 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

## 2019-01-16 ENCOUNTER — Other Ambulatory Visit: Payer: Self-pay

## 2019-01-16 ENCOUNTER — Ambulatory Visit (INDEPENDENT_AMBULATORY_CARE_PROVIDER_SITE_OTHER): Payer: Self-pay | Admitting: Family Medicine

## 2019-01-16 ENCOUNTER — Encounter: Payer: Self-pay | Admitting: Family Medicine

## 2019-01-16 VITALS — BP 126/74 | HR 105 | Temp 98.3°F | Ht 61.0 in | Wt 343.0 lb

## 2019-01-16 DIAGNOSIS — R5383 Other fatigue: Secondary | ICD-10-CM

## 2019-01-16 DIAGNOSIS — M5440 Lumbago with sciatica, unspecified side: Secondary | ICD-10-CM

## 2019-01-16 DIAGNOSIS — Z3009 Encounter for other general counseling and advice on contraception: Secondary | ICD-10-CM

## 2019-01-16 DIAGNOSIS — G8929 Other chronic pain: Secondary | ICD-10-CM

## 2019-01-16 LAB — POCT GLYCOSYLATED HEMOGLOBIN (HGB A1C): HEMOGLOBIN A1C: 5.4 % (ref 4.0–5.6)

## 2019-01-16 NOTE — Progress Notes (Signed)
[facility] Phone: [phone]   cc: contraception, fatigue  Subjective:  Patient would like to have a nexplenon placed.  She is not currently sexually active.   She Hasn't been to a doctor in a while outside of her prenatal visits for her 73 and 34yr old.  She got gestational diabetes with both kids as well as gestational HTN.  sometimes her blood pressure is slightly elevated.but then comes down when it is remeasured. She has gained 80 lbs with her daughter and has not lost it.   Fatigue - patient is complaining of general fatigue during the day and falling asleep often at work.  She Falls asleep for a minute and then wakes up. Wakes up at night gasping for breath every night.  No history of asthma or allergies.  Snores very loudly. Falls alseep at least 4 times a day.  Feels tired throughout the day. She also is coughing at night when she wakes up.  Back pain - stabbing midline pain in the lower back.  Pain runs down the right leg occasionally, maybe once a month. She will take advil or alleve for it. She describes it as 'pins and needles'.   Urinating normally.  No dysuria.     Health Maintenance:  Pap: was normal and at least three years ago.    Mammogram: never had one Colorectal cancer screen: doesn't apply  ROS: denies: chest pain, abdominal pain, n/v/d, dyusria.   Endorses: headaches  Medical History: gestaional htn, GDM, back pain.    Surgical History: 2 c/s.    Obstetric History: both delivered at 37 weeks.   Social History:  Work:  Doesn't work due to falling asleep at work. Since February 10th.  She was a Building surveyor.   Relationship: single.  Gets along with baby's father.   Sexually active?: not currently.  Wants the nexplenon.  Contraceptive use: none  Smoking: smoker  Not every day.   Other Tobacco: no Alcohol: none Drug use: N/A   Objective: BP 126/74   Pulse (!) 105   Temp 98.3 F (36.8 C) (Oral)   Ht 5\' 1"  (1.549 m)   Wt  (!) 343 lb (155.6 kg)   SpO2 97%   BMI 64.81 kg/m  Body mass index is 64.81 kg/m. Gen: NAD, alert and oriented, cooperative with exam. Morbidly obese. HEENT: NCAT, EOMI, MMM Neck: no lymphadenopathy CV: tachycardic rate, regular rhythm. No murmurs, no rubs. No pitting edema.  Resp: LCTAB, no wheezes, crackles. normal work of breathing GI: nontender to palpation, BS present, no guarding or organomegaly Msk: No edema, warm, normal tone, moves UE/LE spontaneously Neuro: CN II-XII grossly intact. no gross deficits Skin: No rashes, no lesions Psych: Appropriate behavior  Assessment/Plan: Contraceptive management - patient not currently sexually active.  She has two children, youngest 4 years old.  She would like a nexplenon placed.   - scheduled f/u appointment next week for nexplenon placement.   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  Likely related to her obesity.  Sciatic pain is only occasional and not currently experiencing it.  Taking NSAIDs PRN for her pain.   - can consider gabapentin in future if sciatic pain becomes worse and after her nexplenon is placed.    Obesity, morbid (HCC) - patient has BMI currently of 64.81.   - lipid panel and A1c ordered.  - patient would like to discuss weight loss.  Gave her [facility] Almas information, although not sure if  her current insurance will cover visits.     Fatigue - pt symptoms sound like obstructive sleep apnea causing daytime fatigue.  Her mother states she snores very loudly, she is morbidly obese, and is not currently on medications that would cause drowsiness.  - sleep studies not currently covered by her insurance - weight loss will be her only realistic option for improvement of symptoms unless she can switch to another insurance plan.  - cbc ordered to r/u anemia    Health Maintenance due:   Health Maintenance Due  Topic Date Due  . TETANUS/TDAP  08/12/2004  . PAP SMEAR-Modifier  08/12/2006  .  INFLUENZA VACCINE  06/28/2018     Due to the above stated risk factors, the patient should be screened for the following:  Diabetes: Yes  Hyperlipidemia: Yes  STI: No   Frederic Jericho, MD PGY-1

## 2019-01-16 NOTE — Patient Instructions (Addendum)
It was nice to meet you today.    There are several things I would like to discuss further with you, so I would like you to come back in a couple weeks for another appointment.  We can put in the nexplenon at that time too.  Your lab results will also be back by then so we can discuss those as well.    Your blood pressure went back down on the second reading, but I would still like to keep an eye on it.    Your daytime sleepiness is probably caused by sleep apnea, which is also causing you to wake up gasping for breath.  The only cure for this is weight loss.  Medicaid family planning won't pay for a sleep study, which is what you would need to get before you are allowed to get a CPAP machine to help with your breathing.    I will see you again in a few weeks.    Have a great day,   Frederic Jericho, MD

## 2019-01-17 DIAGNOSIS — G4733 Obstructive sleep apnea (adult) (pediatric): Secondary | ICD-10-CM | POA: Insufficient documentation

## 2019-01-17 DIAGNOSIS — M5440 Lumbago with sciatica, unspecified side: Secondary | ICD-10-CM

## 2019-01-17 DIAGNOSIS — Z309 Encounter for contraceptive management, unspecified: Secondary | ICD-10-CM | POA: Insufficient documentation

## 2019-01-17 DIAGNOSIS — G8929 Other chronic pain: Secondary | ICD-10-CM | POA: Insufficient documentation

## 2019-01-17 LAB — CBC WITH DIFFERENTIAL/PLATELET
Basophils Absolute: 0 10*3/uL (ref 0.0–0.2)
Basos: 0 %
EOS (ABSOLUTE): 0.1 10*3/uL (ref 0.0–0.4)
Eos: 1 %
Hematocrit: 42.2 % (ref 34.0–46.6)
Hemoglobin: 14.6 g/dL (ref 11.1–15.9)
IMMATURE GRANS (ABS): 0.1 10*3/uL (ref 0.0–0.1)
Immature Granulocytes: 1 %
LYMPHS: 23 %
Lymphocytes Absolute: 1.9 10*3/uL (ref 0.7–3.1)
MCH: 27.4 pg (ref 26.6–33.0)
MCHC: 34.6 g/dL (ref 31.5–35.7)
MCV: 79 fL (ref 79–97)
Monocytes Absolute: 0.5 10*3/uL (ref 0.1–0.9)
Monocytes: 6 %
NEUTROS ABS: 5.5 10*3/uL (ref 1.4–7.0)
Neutrophils: 69 %
Platelets: 319 10*3/uL (ref 150–450)
RBC: 5.33 x10E6/uL — ABNORMAL HIGH (ref 3.77–5.28)
RDW: 13.7 % (ref 11.7–15.4)
WBC: 8 10*3/uL (ref 3.4–10.8)

## 2019-01-17 LAB — LIPID PANEL
Chol/HDL Ratio: 3.3 ratio (ref 0.0–4.4)
Cholesterol, Total: 170 mg/dL (ref 100–199)
HDL: 52 mg/dL (ref >39)
LDL CALC: 80 mg/dL (ref 0–99)
Triglycerides: 192 mg/dL — ABNORMAL HIGH (ref 0–149)
VLDL CHOLESTEROL CAL: 38 mg/dL (ref 5–40)

## 2019-01-17 NOTE — Assessment & Plan Note (Signed)
-   chronic issue.  Likely related to her obesity.  Sciatic pain is only occasional and not currently experiencing it.  Taking NSAIDs PRN for her pain.   - can consider gabapentin in future if sciatic pain becomes worse and after her nexplenon is placed.

## 2019-01-17 NOTE — Assessment & Plan Note (Signed)
-   patient not currently sexually active.  She has two children, youngest 34 years old.  She would like a nexplenon placed.   - scheduled f/u appointment next week for nexplenon placement.

## 2019-01-17 NOTE — Assessment & Plan Note (Signed)
-   pt symptoms sound like obstructive sleep apnea causing daytime fatigue.  Her mother states she snores very loudly, she is morbidly obese, and is not currently on medications that would cause drowsiness.  - sleep studies not currently covered by her insurance - weight loss will be her only realistic option for improvement of symptoms unless she can switch to another insurance plan.

## 2019-01-17 NOTE — Assessment & Plan Note (Signed)
-   patient has BMI currently of 64.81.   - lipid panel and A1c ordered.  - patient would like to discuss weight loss.  Gave her [facility] Al[facility] information, although not sure if her current insurance will cover visits.

## 2019-01-18 ENCOUNTER — Telehealth: Payer: Self-pay | Admitting: Licensed Clinical Social Worker

## 2019-01-18 NOTE — Telephone Encounter (Signed)
Type of Service: Clinical Social Work  [facility] intern phone call to patient upon receiving consult from Dr. Corky Downs regarding assisting the patient with insurance and applying for the Affordable Care Act. [facility] intern left phone number for patient to contact at earliest date.    Plan: 1. [facility] intern will follow up with patient in 5-7 business days if no call has been received from patient.  Nilsa Nutting, SW intern Behavioral Health Clinician,  [facility] [phone]

## 2019-01-24 ENCOUNTER — Telehealth: Payer: Self-pay | Admitting: Licensed Clinical Social Worker

## 2019-01-24 NOTE — Telephone Encounter (Signed)
Type of Service: Clinical Social Work  [facility] intern second phone call to patient upon receiving a consult from Dr. Corky Downs.Did not reach patient but left a message for patient with a resource to call for possible assistance [place] [facility]  [phone]  with applying for the Affordable Care Act. Also left patient [facility] intern phone number.  Plan 1. [facility] intern will wait for a follow up call from the patient.   Nilsa Nutting, SW intern Behavioral Health Clinician,  [facility] [phone]

## 2019-01-25 ENCOUNTER — Ambulatory Visit (INDEPENDENT_AMBULATORY_CARE_PROVIDER_SITE_OTHER): Payer: Self-pay | Admitting: Family Medicine

## 2019-01-25 ENCOUNTER — Other Ambulatory Visit: Payer: Self-pay

## 2019-01-25 ENCOUNTER — Encounter: Payer: Self-pay | Admitting: Family Medicine

## 2019-01-25 ENCOUNTER — Ambulatory Visit (INDEPENDENT_AMBULATORY_CARE_PROVIDER_SITE_OTHER): Payer: Medicaid Other | Admitting: Licensed Clinical Social Worker

## 2019-01-25 VITALS — BP 125/85 | HR 120 | Temp 98.7°F | Wt 344.0 lb

## 2019-01-25 DIAGNOSIS — Z789 Other specified health status: Secondary | ICD-10-CM

## 2019-01-25 DIAGNOSIS — Z30019 Encounter for initial prescription of contraceptives, unspecified: Secondary | ICD-10-CM

## 2019-01-25 DIAGNOSIS — G4733 Obstructive sleep apnea (adult) (pediatric): Secondary | ICD-10-CM

## 2019-01-25 DIAGNOSIS — Z3046 Encounter for surveillance of implantable subdermal contraceptive: Secondary | ICD-10-CM

## 2019-01-25 DIAGNOSIS — Z30017 Encounter for initial prescription of implantable subdermal contraceptive: Secondary | ICD-10-CM

## 2019-01-25 LAB — POCT URINE PREGNANCY: Preg Test, Ur: NEGATIVE

## 2019-01-25 MED ORDER — ETONOGESTREL 68 MG ~~LOC~~ IMPL
68.0000 mg | DRUG_IMPLANT | Freq: Once | SUBCUTANEOUS | Status: AC
  Administered 2019-01-25: 68 mg via SUBCUTANEOUS

## 2019-01-25 NOTE — BH Specialist Note (Addendum)
Type of Service: Clinical Social Work  [facility] intern warm hand off from Dr. Corky Downs to speak with patient regarding her insurance. Patient currently receives family planning medicaid and would like to look into some other options since this will not cover her for the procedures she needs done. Patient was directed to the department of social services for further assistance regarding her situation and also given the number to [facility] Navigator [phone].   Plan: Patient will  1. Follow up with [facility] intern if help is needed during this process. 2. Go to DSS for further questions regarding Medicaid. 3. Talk with her Employer regarding FMLA   [facility] intern will 1. Follow up with patient in 5-7 days.   Nilsa Nutting, SW intern Behavioral Health Clinician,  [facility] [phone]

## 2019-01-25 NOTE — Patient Instructions (Signed)
It was great to see you again today,   If you need any more help getting insurance or have forms for me to fill out please call the office and let us know.    Please continue to use a second form of contraception for the next 7 days.  If you have bleeding issues during the initial few months call our office and we can prescribe a short course of oral contraceptives for you.    If there's anything else I can do to help you, please let us know.    Have a great day,   Frederic Jericho, MD.

## 2019-01-25 NOTE — Progress Notes (Signed)
   [facility] Phone: [phone]   cc: Nexplanon placement, daytime drowsiness  Subjective:  Sleepiness : Patient still complaining of falling asleep during the day.  It happened 4 times yesterday.  Episodes last for a few minutes at a time.  She has not been back to work since her last visit.  She is not on paid leave.  She is worried that she might fall asleep while driving.  She denies headaches.  Nexplanon: Patient wants placement of Nexplanon L ARC.  She is not currently having any dysuria, vaginal itching.  She is not currently menstruating.  ROS: See HPI for pertinent positives and negatives  Past Medical History  Family history reviewed for today's visit. No changes.  Social history- patient is a non-smoker  Objective: BP 125/85   Pulse (!) 120   Temp 98.7 F (37.1 C) (Oral)   Wt (!) 344 lb (156 kg)   LMP 01/14/2019   SpO2 97%   BMI 65.00 kg/m  Gen: NAD, alert and oriented, cooperative with exam.  Obese. CV: normal rate, regular rhythm. No murmurs, no rubs.  Resp: LCTAB, no wheezes, crackles. normal work of breathing Skin: No rashes, no lesions Psych: Appropriate behavior  Assessment/Plan: Contraceptive management Risks benefits of Nexplanon were discussed with patient.  Patient consented to have Nexplanon placed.  Urine pregnancy test was performed, which was negative.  Nexplanon was placed without difficulty.  Patient was advised of potential for irregular bleeding especially in the first 3 months, for which patient can receive short-term course of oral contraceptives to help reduce bleeding.  Obstructive sleep apnea Patient is still complaining of daytime drowsiness and intermittent episodes of somnolence lasting a few minutes.  This is interfered with her ability to function at her job.  FMLA paperwork was filled out.  Patient needs referral for sleep study and CPAP, but cannot get this due to lack of insurance.  Discussed with patient her  options for insurance, which include subsidies on the healthcare marketplace or Medicaid.  Introduced patient to [employer], for help with insurance needs.   Frederic Jericho, MD PGY-1

## 2019-01-25 NOTE — Addendum Note (Signed)
Addended by: Jone Baseman D on: 01/25/2019 04:06 PM   Modules accepted: Orders

## 2019-01-25 NOTE — Assessment & Plan Note (Signed)
Patient is still complaining of daytime drowsiness and intermittent episodes of somnolence lasting a few minutes.  This is interfered with her ability to function at her job.  FMLA paperwork was filled out.  Patient needs referral for sleep study and CPAP, but cannot get this due to lack of insurance.  Discussed with patient her options for insurance, which include subsidies on the healthcare marketplace or Medicaid.  Introduced patient to [employer], for help with insurance needs.

## 2019-01-25 NOTE — Assessment & Plan Note (Signed)
Risks benefits of Nexplanon were discussed with patient.  Patient consented to have Nexplanon placed.  Urine pregnancy test was performed, which was negative.  Nexplanon was placed without difficulty.  Patient was advised of potential for irregular bleeding especially in the first 3 months, for which patient can receive short-term course of oral contraceptives to help reduce bleeding.

## 2019-02-06 ENCOUNTER — Telehealth: Payer: Self-pay | Admitting: Licensed Clinical Social Worker

## 2019-02-06 NOTE — Telephone Encounter (Signed)
Type of Service: Clinical Social Work  [facility] intern phone call to patient to follow up on if patient was able to get connected to [employer] provided. Left message with patient and follow up number to call if any additional assistance is needed Plan: 1. Patient will follow up with [facility] intern if any additional assistance is needed. 2. [facility] intern will wait for patients call.  Nilsa Nutting, SW intern Behavioral Health Clinician,  [facility] [phone]

## 2019-02-08 ENCOUNTER — Telehealth: Payer: Self-pay | Admitting: Licensed Clinical Social Worker

## 2019-02-08 NOTE — Telephone Encounter (Signed)
Type of Service: Clinical Social Work  [facility] intern phone call to patient to follow up on if patient was able to get connected to resources for insurance. [facility] intern left message with patient with follow up number.   Plan: 1. Patient will follow up with [facility] intern. 2. [facility] intern will wait for call from patient.  Nilsa Nutting, SW intern Behavioral Health Clinician,  [facility] [phone]

## 2019-03-22 ENCOUNTER — Ambulatory Visit: Payer: Medicaid Other | Admitting: Family Medicine

## 2020-08-30 IMAGING — CR DG CHEST 2V
2 series · 2 of 2 positions shown · non-contrast
Comparison: April 18, 2017

CLINICAL DATA: Cough and shortness of breath

EXAM:
CHEST - 2 VIEW

[w chest pa]
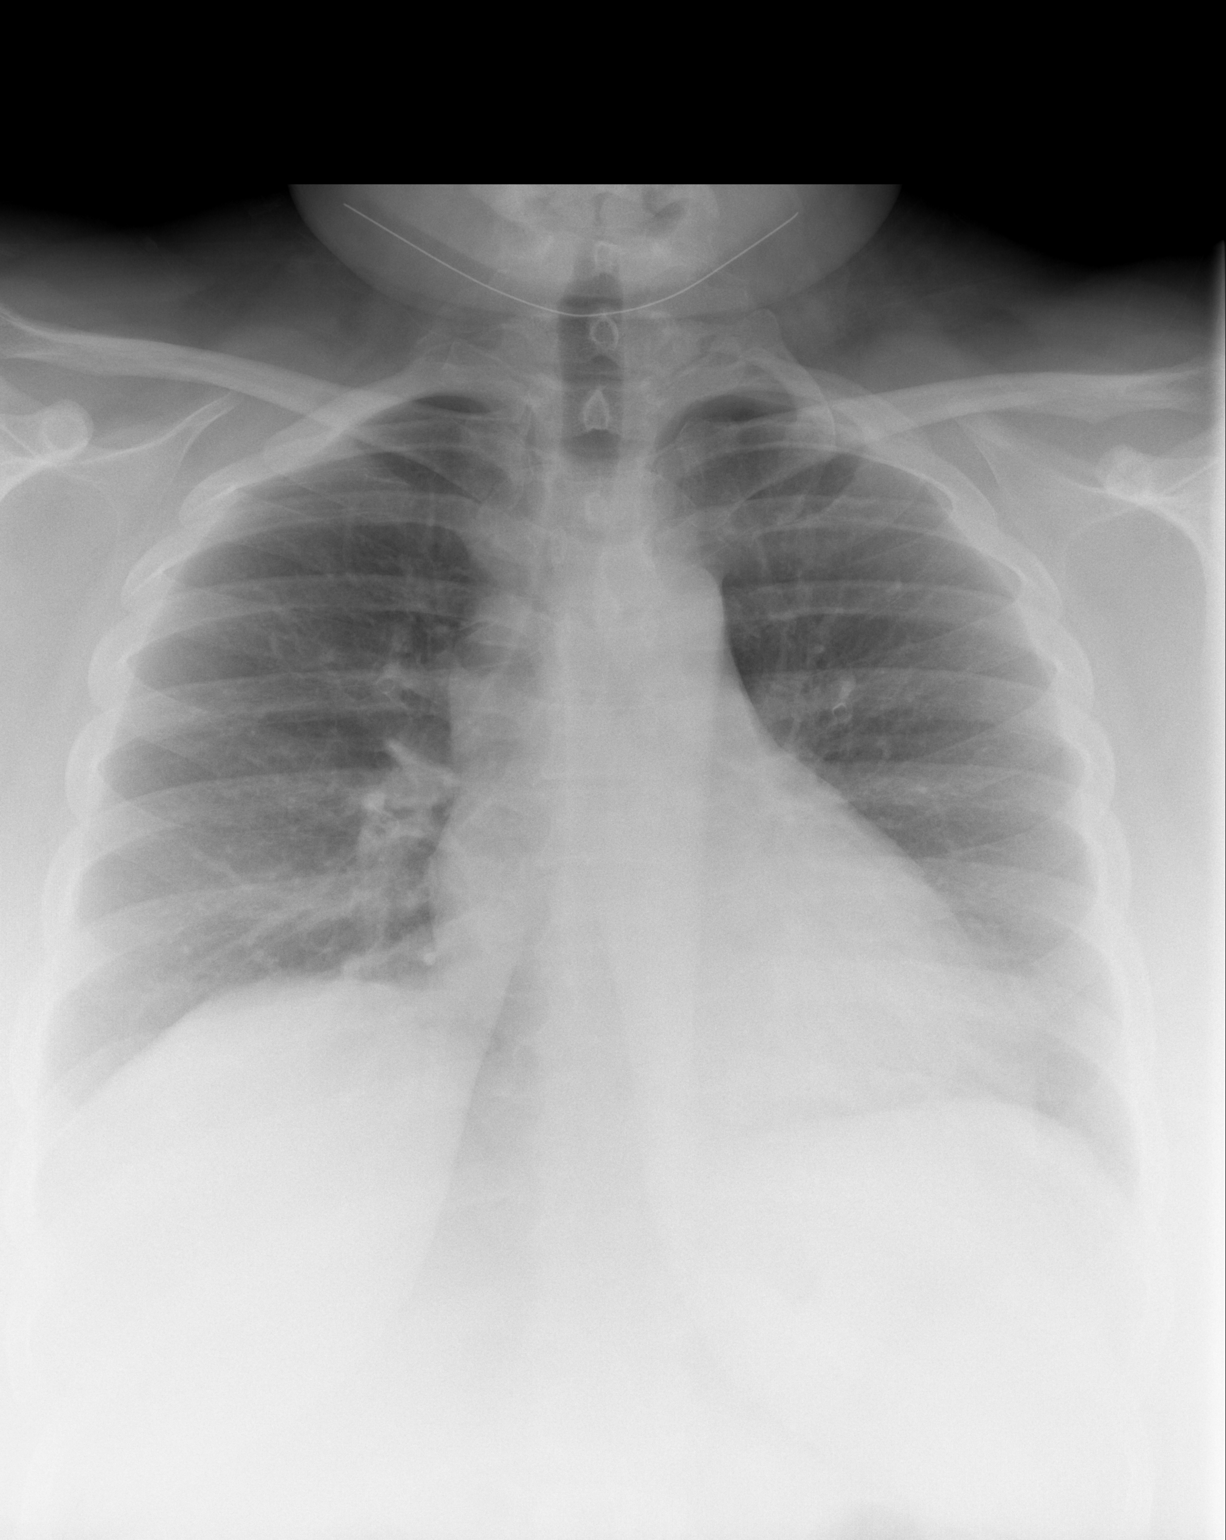

[w chest lat *]
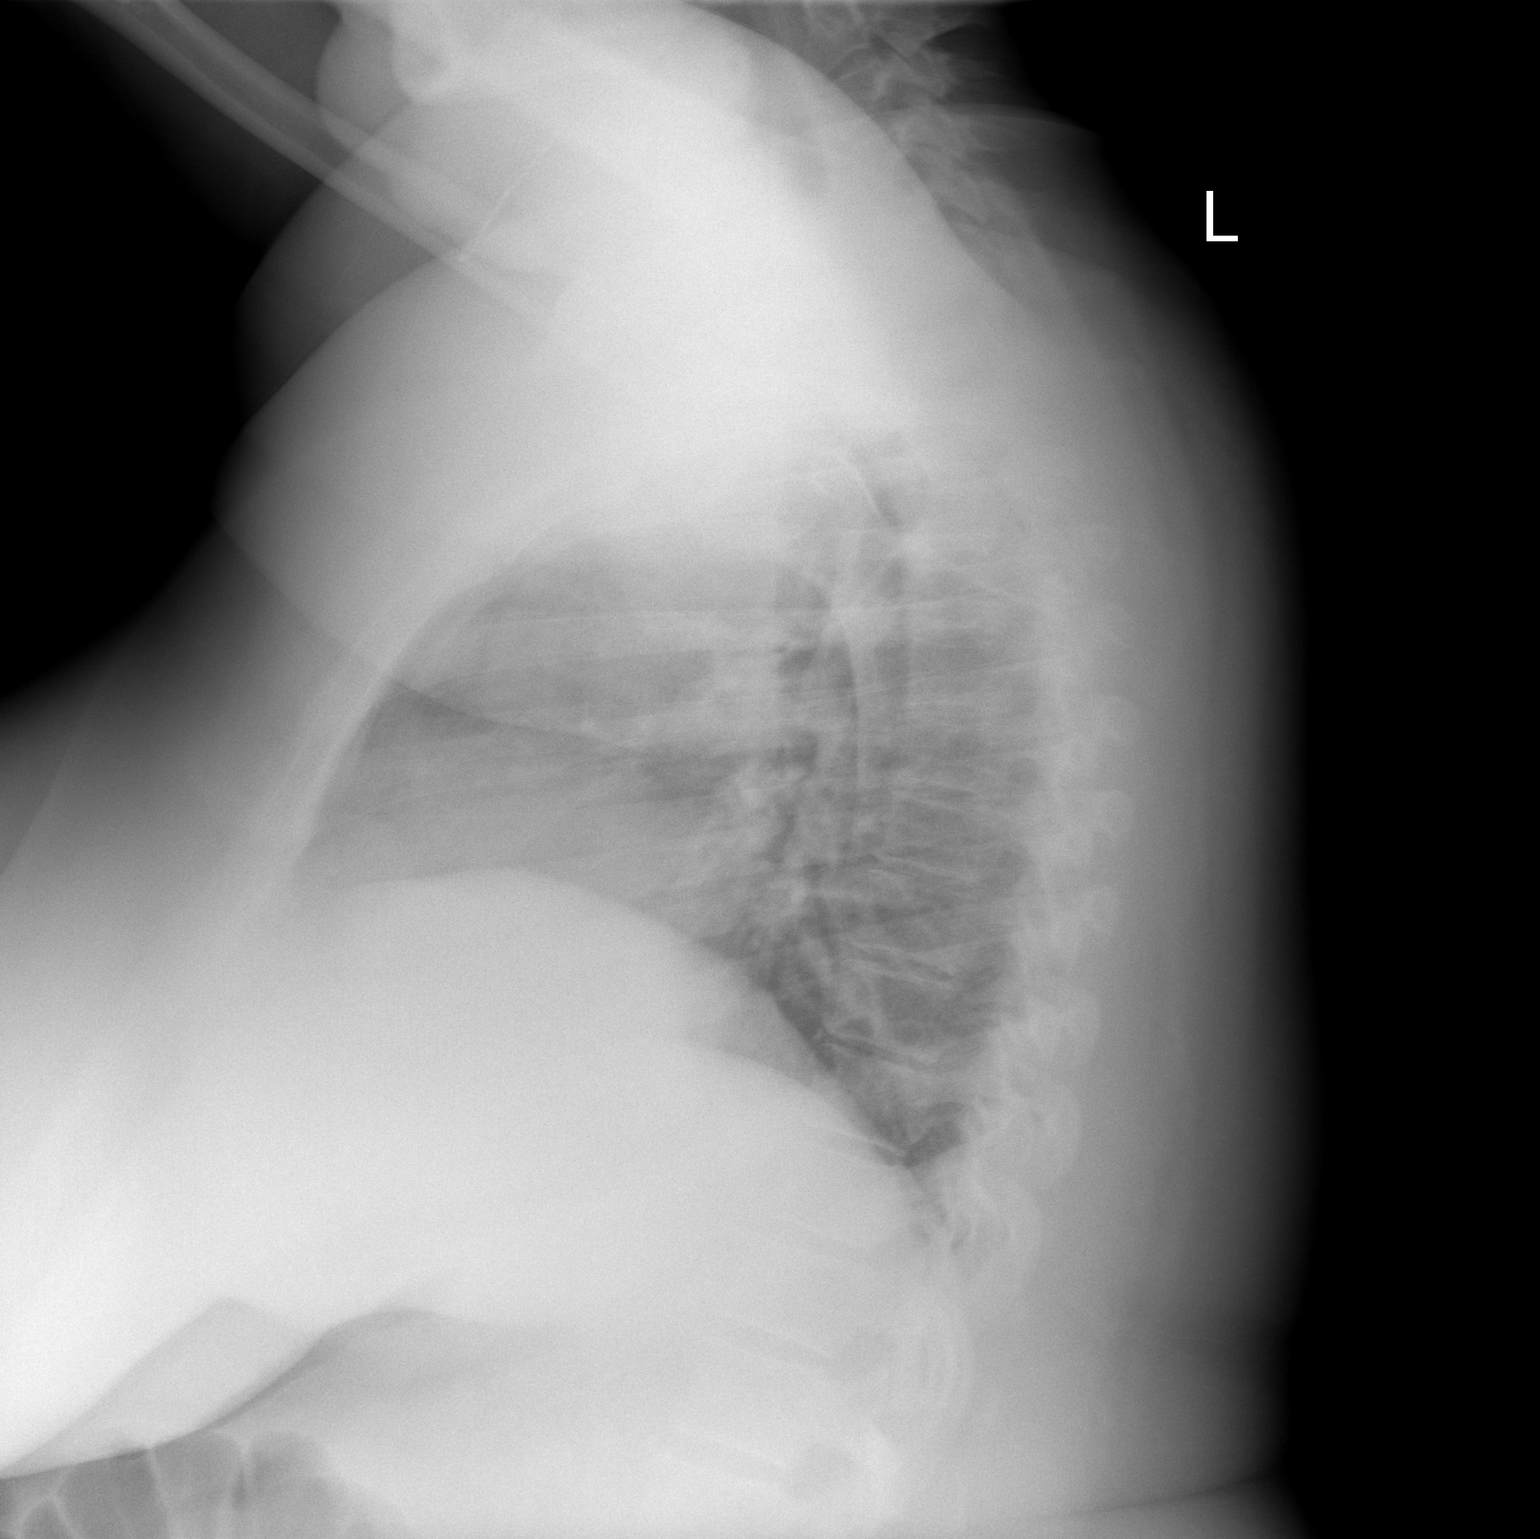

[2 of 2 positions shown; findings below may reference images not displayed]

FINDINGS: There is no edema or consolidation. Heart is upper normal in size
with pulmonary vascularity normal. No adenopathy. No bone lesions
evident.
IMPRESSION: No edema or consolidation.
